# Patient Record
Sex: Female | Born: 1995 | Hispanic: Yes | Marital: Single | Smoking: Never smoker
Health system: Southern US, Community
[De-identification: ages and names within clinical notes are randomized; demographics above are authoritative.]

## PROBLEM LIST (undated history)

## (undated) ENCOUNTER — Inpatient Hospital Stay (HOSPITAL_COMMUNITY): Payer: Self-pay

## (undated) DIAGNOSIS — Z789 Other specified health status: Secondary | ICD-10-CM

## (undated) DIAGNOSIS — N83209 Unspecified ovarian cyst, unspecified side: Secondary | ICD-10-CM

## (undated) DIAGNOSIS — O24419 Gestational diabetes mellitus in pregnancy, unspecified control: Secondary | ICD-10-CM

## (undated) HISTORY — DX: Gestational diabetes mellitus in pregnancy, unspecified control: O24.419

## (undated) HISTORY — PX: NO PAST SURGERIES: SHX2092

---

## 2014-10-30 ENCOUNTER — Emergency Department (HOSPITAL_COMMUNITY)
Admission: EM | Admit: 2014-10-30 | Discharge: 2014-10-30 | Disposition: A | Discharge status: Home / Self Care | Payer: Self-pay | Attending: Emergency Medicine | Admitting: Emergency Medicine

## 2014-10-30 ENCOUNTER — Encounter (HOSPITAL_COMMUNITY): Payer: Self-pay | Admitting: Emergency Medicine

## 2014-10-30 DIAGNOSIS — Z3202 Encounter for pregnancy test, result negative: Secondary | ICD-10-CM | POA: Insufficient documentation

## 2014-10-30 DIAGNOSIS — R1032 Left lower quadrant pain: Secondary | ICD-10-CM | POA: Insufficient documentation

## 2014-10-30 DIAGNOSIS — Z8742 Personal history of other diseases of the female genital tract: Secondary | ICD-10-CM | POA: Insufficient documentation

## 2014-10-30 DIAGNOSIS — R1031 Right lower quadrant pain: Secondary | ICD-10-CM | POA: Insufficient documentation

## 2014-10-30 DIAGNOSIS — R102 Pelvic and perineal pain: Secondary | ICD-10-CM | POA: Insufficient documentation

## 2014-10-30 HISTORY — DX: Unspecified ovarian cyst, unspecified side: N83.209

## 2014-10-30 LAB — CBC WITH DIFFERENTIAL/PLATELET
Basophils Absolute: 0 10*3/uL (ref 0.0–0.1)
Basophils Relative: 0 % (ref 0–1)
Eosinophils Absolute: 0.1 10*3/uL (ref 0.0–0.7)
Eosinophils Relative: 1 % (ref 0–5)
HEMATOCRIT: 44.6 % (ref 36.0–46.0)
HEMOGLOBIN: 15.2 g/dL — AB (ref 12.0–15.0)
Lymphocytes Relative: 22 % (ref 12–46)
Lymphs Abs: 2 10*3/uL (ref 0.7–4.0)
MCH: 28.8 pg (ref 26.0–34.0)
MCHC: 34.1 g/dL (ref 30.0–36.0)
MCV: 84.5 fL (ref 78.0–100.0)
MONO ABS: 0.9 10*3/uL (ref 0.1–1.0)
MONOS PCT: 9 % (ref 3–12)
NEUTROS ABS: 6.1 10*3/uL (ref 1.7–7.7)
Neutrophils Relative %: 68 % (ref 43–77)
PLATELETS: 294 10*3/uL (ref 150–400)
RBC: 5.28 MIL/uL — ABNORMAL HIGH (ref 3.87–5.11)
RDW: 13.2 % (ref 11.5–15.5)
WBC: 9 10*3/uL (ref 4.0–10.5)

## 2014-10-30 LAB — URINALYSIS, ROUTINE W REFLEX MICROSCOPIC
BILIRUBIN URINE: NEGATIVE
Ketones, ur: NEGATIVE mg/dL
Leukocytes, UA: NEGATIVE
Nitrite: NEGATIVE
Protein, ur: 100 mg/dL — AB
SPECIFIC GRAVITY, URINE: 1.027 (ref 1.005–1.030)
UROBILINOGEN UA: 1 mg/dL (ref 0.0–1.0)
pH: 6 (ref 5.0–8.0)

## 2014-10-30 LAB — COMPREHENSIVE METABOLIC PANEL
ALT: 175 U/L — AB (ref 14–54)
ANION GAP: 10 (ref 5–15)
AST: 70 U/L — ABNORMAL HIGH (ref 15–41)
Albumin: 3.9 g/dL (ref 3.5–5.0)
Alkaline Phosphatase: 91 U/L (ref 38–126)
BUN: 10 mg/dL (ref 6–20)
CO2: 23 mmol/L (ref 22–32)
Calcium: 9.5 mg/dL (ref 8.9–10.3)
Chloride: 100 mmol/L — ABNORMAL LOW (ref 101–111)
Creatinine, Ser: 0.49 mg/dL (ref 0.44–1.00)
GFR calc Af Amer: >60 mL/min (ref >60)
Glucose, Bld: 217 mg/dL — ABNORMAL HIGH (ref 65–99)
Potassium: 3.9 mmol/L (ref 3.5–5.1)
SODIUM: 133 mmol/L — AB (ref 135–145)
Total Bilirubin: 0.5 mg/dL (ref 0.3–1.2)
Total Protein: 7 g/dL (ref 6.5–8.1)

## 2014-10-30 LAB — URINE MICROSCOPIC-ADD ON

## 2014-10-30 LAB — LIPASE, BLOOD: LIPASE: 40 U/L (ref 22–51)

## 2014-10-30 LAB — POC URINE PREG, ED: Preg Test, Ur: NEGATIVE

## 2014-10-30 MED ORDER — HYDROCODONE-ACETAMINOPHEN 5-325 MG PO TABS: 1.0000 | ORAL_TABLET | ORAL | Status: DC | PRN

## 2014-10-30 MED ORDER — IBUPROFEN 800 MG PO TABS
800.0000 mg | ORAL_TABLET | Freq: Once | ORAL | Status: AC
  Administered 2014-10-30: 800 mg via ORAL
  Filled 2014-10-30: qty 1

## 2014-10-30 MED ORDER — NAPROXEN 500 MG PO TABS: 500.0000 mg | ORAL_TABLET | Freq: Two times a day (BID) | ORAL | Status: DC

## 2014-10-30 NOTE — Discharge Instructions (Signed)
Pelvic Pain Pelvic pain is pain felt below the belly button and between your hips. It can be caused by many different things. It is important to get help right away. This is especially true for severe, sharp, or unusual pain that comes on suddenly.  HOME CARE  Only take medicine as told by your doctor.  Rest as told by your doctor.  Eat a healthy diet, such as fruits, vegetables, and lean meats.  Drink enough fluids to keep your pee (urine) clear or pale yellow, or as told.  Avoid sex (intercourse) if it causes pain.  Apply warm or cold packs to your lower belly (abdomen). Use the type of pack that helps the pain.  Avoid situations that cause you stress.  Keep a journal to track your pain. Write down:  When the pain started.  Where it is located.  If there are things that seem to be related to the pain, such as food or your period.  Follow up with your doctor as told. GET HELP RIGHT AWAY IF:   You have heavy bleeding from the vagina.  You have more pelvic pain.  You feel lightheaded or pass out (faint).  You have chills.  You have pain when you pee or have blood in your pee.  You cannot stop having watery poop (diarrhea).  You cannot stop throwing up (vomiting).  You have a fever or lasting symptoms for more than 3 days.  You have a fever and your symptoms suddenly get worse.  You are being physically or sexually abused.  Your medicine does not help your pain.  You have fluid (discharge) coming from your vagina that is not normal. MAKE SURE YOU:  Understand these instructions.  Will watch your condition.  Will get help if you are not doing well or get worse. Document Released: 10/28/2007 Document Revised: 11/10/2011 Document Reviewed: 08/31/2011 ExitCare Patient Information 2015 [employer]. This information is not intended to replace advice given to you by your health care provider. Make sure you discuss any questions you have with your health care  provider.  

## 2014-10-30 NOTE — ED Provider Notes (Signed)
CSN: [id number]     Arrival date & time 10/30/14  4098 History   First MD Initiated Contact with Patient 10/30/14 [phone]     Chief Complaint  Patient presents with  . Abdominal Pain      HPI  She presents for evaluation of lower abdominal pain. She states is been present for the last 8 months. She had a negative ultrasound 8 months ago. Was told one point that it could be a cyst. However she states this was not demonstrated on ultrasound. This bilateral lower quadrants, right greater than left. His worthless menses. She does not have any spotting. She denies discharge in between her periods or any spotting. No dysuria frequency. No pain with intercourse.  No fevers. No nausea vomiting diarrhea.  Past Medical History  Diagnosis Date  . Ovarian cyst    History reviewed. No pertinent past surgical history. History reviewed. No pertinent family history. History  Substance Use Topics  . Smoking status: Never Smoker   . Smokeless tobacco: Not on file  . Alcohol Use: No   OB History    No data available     Review of Systems  Constitutional: Negative for fever, chills, diaphoresis, appetite change and fatigue.  HENT: Negative for mouth sores, sore throat and trouble swallowing.   Eyes: Negative for visual disturbance.  Respiratory: Negative for cough, chest tightness, shortness of breath and wheezing.   Cardiovascular: Negative for chest pain.  Gastrointestinal: Negative for nausea, vomiting, abdominal pain, diarrhea and abdominal distention.  Endocrine: Negative for polydipsia, polyphagia and polyuria.  Genitourinary: Positive for pelvic pain. Negative for dysuria, frequency and hematuria.  Musculoskeletal: Negative for gait problem.  Skin: Negative for color change, pallor and rash.  Neurological: Negative for dizziness, syncope, light-headedness and headaches.  Hematological: Does not bruise/bleed easily.  Psychiatric/Behavioral: Negative for behavioral problems and confusion.       Allergies  Review of patient's allergies indicates no known allergies.  Home Medications   Prior to Admission medications   Medication Sig Start Date End Date Taking? Authorizing Provider  HYDROcodone-acetaminophen (NORCO/VICODIN) 5-325 MG per tablet Take 1 tablet by mouth every 4 (four) hours as needed. 10/30/14   Rolland Porter, MD  naproxen (NAPROSYN) 500 MG tablet Take 1 tablet (500 mg total) by mouth 2 (two) times daily. 10/30/14   Rolland Porter, MD   BP 115/78 mmHg  Pulse 93  Temp(Src) 98.1 F (36.7 C) (Oral)  Resp 13  SpO2 100%  LMP 10/21/2014 (Exact Date) Physical Exam  Constitutional: She is oriented to person, place, and time. She appears well-developed and well-nourished. No distress.  HENT:  Head: Normocephalic.  Eyes: Conjunctivae are normal. Pupils are equal, round, and reactive to light. No scleral icterus.  Neck: Normal range of motion. Neck supple. No thyromegaly present.  Cardiovascular: Normal rate and regular rhythm.  Exam reveals no gallop and no friction rub.   No murmur heard. Pulmonary/Chest: Effort normal and breath sounds normal. No respiratory distress. She has no wheezes. She has no rales.  Abdominal: Soft. Bowel sounds are normal. She exhibits no distension. There is no tenderness. There is no rebound.    Musculoskeletal: Normal range of motion.  Neurological: She is alert and oriented to person, place, and time.  Skin: Skin is warm and dry. No rash noted.  Psychiatric: She has a normal mood and affect. Her behavior is normal.    ED Course  Procedures (including critical care time) Labs Review Labs Reviewed  CBC WITH DIFFERENTIAL/PLATELET - Abnormal;  Notable for the following:    RBC 5.28 (*)    Hemoglobin 15.2 (*)    All other components within normal limits  COMPREHENSIVE METABOLIC PANEL - Abnormal; Notable for the following:    Sodium 133 (*)    Chloride 100 (*)    Glucose, Bld 217 (*)    AST 70 (*)    ALT 175 (*)    All other components  within normal limits  URINALYSIS, ROUTINE W REFLEX MICROSCOPIC (NOT AT [facility]) - Abnormal; Notable for the following:    APPearance CLOUDY (*)    Glucose, UA >1000 (*)    Hgb urine dipstick MODERATE (*)    Protein, ur 100 (*)    All other components within normal limits  URINE MICROSCOPIC-ADD ON - Abnormal; Notable for the following:    Bacteria, UA FEW (*)    All other components within normal limits  LIPASE, BLOOD  POC URINE PREG, ED    Imaging Review No results found.   EKG Interpretation None      MDM   Final diagnoses:  Pelvic pain in female    Patient describes episodes over the last 8 months and almost daily discomfort. Worse during menstrual cycle. Declines pelvic exam here. She is adamant that she does not have discharge or pain with intercourse. Discussed the possibility of occult pelvic infection. She still declines. Her symptoms are consistent with chronic pelvic pain and perhaps endometriosis. [facility]. Of offer her anti-inflammatory isn't simple short-term pain control for her symptoms today. She has a reassuring evaluation no fever, leukocytosis, or abnormal urine. She is not pregnant. She is appropriate for outpatient treatment for this pain of 8 months duration.    Rolland PorterMark Harleyquinn Gasser, MD 10/30/14 [phone]

## 2014-10-30 NOTE — ED Notes (Signed)
Bed: WA21 Expected date:  Expected time:  Means of arrival:  Comments: 

## 2014-10-30 NOTE — ED Notes (Signed)
Pt c/o rt low abd pain since Sunday.  States she was dx w/ cyst on her ovary last year.  Denies vomiting but she states she has been having intermittent diarrhea.

## 2014-10-30 NOTE — ED Notes (Signed)
Bed: WA19 Expected date:  Expected time:  Means of arrival:  Comments: 

## 2015-05-09 ENCOUNTER — Ambulatory Visit: Payer: Self-pay | Admitting: Allergy and Immunology

## 2015-06-26 ENCOUNTER — Emergency Department (HOSPITAL_COMMUNITY): Payer: Self-pay

## 2015-06-26 ENCOUNTER — Emergency Department (HOSPITAL_COMMUNITY)
Admission: EM | Admit: 2015-06-26 | Discharge: 2015-06-26 | Disposition: A | Discharge status: Home / Self Care | Payer: Self-pay | Attending: Emergency Medicine | Admitting: Emergency Medicine

## 2015-06-26 ENCOUNTER — Encounter (HOSPITAL_COMMUNITY): Payer: Self-pay | Admitting: Emergency Medicine

## 2015-06-26 DIAGNOSIS — R1031 Right lower quadrant pain: Secondary | ICD-10-CM | POA: Insufficient documentation

## 2015-06-26 DIAGNOSIS — R1030 Lower abdominal pain, unspecified: Secondary | ICD-10-CM | POA: Insufficient documentation

## 2015-06-26 DIAGNOSIS — O9989 Other specified diseases and conditions complicating pregnancy, childbirth and the puerperium: Secondary | ICD-10-CM | POA: Insufficient documentation

## 2015-06-26 DIAGNOSIS — R102 Pelvic and perineal pain unspecified side: Secondary | ICD-10-CM

## 2015-06-26 DIAGNOSIS — Z8742 Personal history of other diseases of the female genital tract: Secondary | ICD-10-CM | POA: Insufficient documentation

## 2015-06-26 DIAGNOSIS — Z3A00 Weeks of gestation of pregnancy not specified: Secondary | ICD-10-CM | POA: Insufficient documentation

## 2015-06-26 DIAGNOSIS — Z3490 Encounter for supervision of normal pregnancy, unspecified, unspecified trimester: Secondary | ICD-10-CM

## 2015-06-26 LAB — URINALYSIS, ROUTINE W REFLEX MICROSCOPIC
Bilirubin Urine: NEGATIVE
Glucose, UA: 250 mg/dL — AB
Ketones, ur: NEGATIVE mg/dL
LEUKOCYTES UA: NEGATIVE
NITRITE: NEGATIVE
PROTEIN: 100 mg/dL — AB
SPECIFIC GRAVITY, URINE: 1.009 (ref 1.005–1.030)
pH: 6 (ref 5.0–8.0)

## 2015-06-26 LAB — URINE MICROSCOPIC-ADD ON

## 2015-06-26 LAB — I-STAT CHEM 8, ED
BUN: 5 mg/dL — ABNORMAL LOW (ref 6–20)
CHLORIDE: 106 mmol/L (ref 101–111)
Calcium, Ion: 1.18 mmol/L (ref 1.12–1.23)
Creatinine, Ser: 0.4 mg/dL — ABNORMAL LOW (ref 0.44–1.00)
Glucose, Bld: 179 mg/dL — ABNORMAL HIGH (ref 65–99)
HEMATOCRIT: 41 % (ref 36.0–46.0)
HEMOGLOBIN: 13.9 g/dL (ref 12.0–15.0)
POTASSIUM: 4.1 mmol/L (ref 3.5–5.1)
SODIUM: 139 mmol/L (ref 135–145)
TCO2: 20 mmol/L (ref 0–100)

## 2015-06-26 LAB — PREGNANCY, URINE: PREG TEST UR: POSITIVE — AB

## 2015-06-26 LAB — CBG MONITORING, ED: GLUCOSE-CAPILLARY: 176 mg/dL — AB (ref 65–99)

## 2015-06-26 LAB — HCG, QUANTITATIVE, PREGNANCY: HCG, BETA CHAIN, QUANT, S: 732 m[IU]/mL — AB (ref <5)

## 2015-06-26 NOTE — ED Notes (Signed)
MD at bedside. 

## 2015-06-26 NOTE — ED Notes (Signed)
Patient transported to Ultrasound 

## 2015-06-26 NOTE — ED Provider Notes (Signed)
CSN: [id number]     Arrival date & time 06/26/15  1191 History   First MD Initiated Contact with Patient 06/26/15 [phone]     Chief Complaint  Patient presents with  . Abdominal Pain     Patient is a 20 y.o. female presenting with abdominal pain. The history is provided by the patient.  Abdominal Pain Pain location:  RLQ Pain quality: dull   Associated symptoms: no chills, no fever, no nausea, no shortness of breath, no vaginal bleeding, no vaginal discharge and no vomiting    patient is complaining of right-sided pelvic pain. Translator bones used to assist with the history. She's had a pain for over 6 months. She seen her clinic and diagnosed with ovarian cysts. States that she did not have an ultrasound or pelvic exam however. The patient has had pain in the whole time but has gotten better and worse at times. No fevers or chills. She states she will sometimes have pain in her ovary when she urinates. No vaginal bleeding or discharge. Last menses was just over a month ago. The pain does seem to get worse around her menses time.  History reviewed. No pertinent past medical history. History reviewed. No pertinent past surgical history. No family history on file. Social History  Substance Use Topics  . Smoking status: Never Smoker   . Smokeless tobacco: None  . Alcohol Use: No   OB History    No data available     Review of Systems  Constitutional: Negative for fever and chills.  Respiratory: Negative for shortness of breath.   Gastrointestinal: Positive for abdominal pain. Negative for nausea and vomiting.  Genitourinary: Positive for pelvic pain. Negative for flank pain, vaginal bleeding, vaginal discharge, genital sores and vaginal pain.  Skin: Negative for color change and rash.      Allergies  Review of patient's allergies indicates no known allergies.  Home Medications   Prior to Admission medications   Not on File   BP 111/80 mmHg  Pulse 88  Temp(Src) 99.2 F (37.3  C) (Oral)  Resp 16  SpO2 100%  LMP 05/25/2015 (Exact Date) Physical Exam  Constitutional: She appears well-developed.  HENT:  Head: Atraumatic.  Neck: Neck supple.  Cardiovascular: Normal rate.   Pulmonary/Chest: Effort normal.  Abdominal: There is tenderness.  Suprapubic and right lower quadrant tenderness without rebound or guarding.  Genitourinary: Vagina normal.  Musculoskeletal: Normal range of motion.  Neurological: She is alert.  Skin: Skin is warm.    ED Course  Procedures (including critical care time) Labs Review Labs Reviewed  URINALYSIS, ROUTINE W REFLEX MICROSCOPIC (NOT AT [facility]) - Abnormal; Notable for the following:    Glucose, UA 250 (*)    Hgb urine dipstick MODERATE (*)    Protein, ur 100 (*)    All other components within normal limits  PREGNANCY, URINE - Abnormal; Notable for the following:    Preg Test, Ur POSITIVE (*)    All other components within normal limits  HCG, QUANTITATIVE, PREGNANCY - Abnormal; Notable for the following:    hCG, Beta Chain, Quant, S 732 (*)    All other components within normal limits  URINE MICROSCOPIC-ADD ON - Abnormal; Notable for the following:    Squamous Epithelial / LPF 6-30 (*)    Bacteria, UA FEW (*)    Casts GRANULAR CAST (*)    All other components within normal limits  CBG MONITORING, ED - Abnormal; Notable for the following:    Glucose-Capillary 176 (*)  All other components within normal limits  I-STAT CHEM 8, ED - Abnormal; Notable for the following:    BUN 5 (*)    Creatinine, Ser 0.40 (*)    Glucose, Bld 179 (*)    All other components within normal limits  RPR  HIV ANTIBODY (ROUTINE TESTING)    Imaging Review US Ob Comp Less 14 Wks  06/26/2015  CLINICAL DATA:  Pelvic pain for 6 months. EXAM: OBSTETRIC <14 WK [place] AND TRANSVAGINAL OB US TECHNIQUE: Both transabdominal and transvaginal ultrasound examinations were performed for complete evaluation of the gestation as well as the maternal uterus, adnexal  regions, and pelvic cul-de-sac. Transvaginal technique was performed to assess early pregnancy. COMPARISON:  None. FINDINGS: Intrauterine gestational sac: Not visualized. Yolk sac:  Not visualized. Embryo:  Not visualized. Cardiac Activity: Not visualized. Maternal uterus/adnexae: The uterus measures 7.7 x 4.4 by 4.0 cm. The endometrium is heterogeneously thickened to 2.1 cm. Right ovary has normal appearance, measuring 2.8 x 1.4 by 1.9 cm. The left ovary measures 1.9 x 3.6 x 2.2 cm. There is a solid-appearing mass on the left ovary which measures 2.0 x 1.9 by 1.4 cm and demonstrates peripherally increased blood flow. There is a small amount of free pelvic fluid most notable within the left adnexa. IMPRESSION: Normal size of the uterus with heterogeneously thickened endometrium to 2.1 cm. No intrauterine pregnancy seen. Normal appearance of the right ovary. Enlarged (in comparison to the right) left ovary which contains a solid appearing 2 cm mass with peripherally increased blood flow. This may represent a physiologic corpus luteum, endometrioma, or potentially beta HCG producing ovarian malignancy such as a germ-cell tumor. In the settings of confirmed positive serum pregnancy test, an ectopic pregnancy may also be considered, however the sonographic appearance is not typical. Small amount of free pelvic fluid, most prominent about the left adnexa. OB/ GY consult is recommended. Electronically Signed   By: Ted Mcalpine M.D.   On: 06/26/2015 11:05   US Ob Transvaginal  06/26/2015  CLINICAL DATA:  Pelvic pain for 6 months. EXAM: OBSTETRIC <14 WK [place] AND TRANSVAGINAL OB US TECHNIQUE: Both transabdominal and transvaginal ultrasound examinations were performed for complete evaluation of the gestation as well as the maternal uterus, adnexal regions, and pelvic cul-de-sac. Transvaginal technique was performed to assess early pregnancy. COMPARISON:  None. FINDINGS: Intrauterine gestational sac: Not visualized.  Yolk sac:  Not visualized. Embryo:  Not visualized. Cardiac Activity: Not visualized. Maternal uterus/adnexae: The uterus measures 7.7 x 4.4 by 4.0 cm. The endometrium is heterogeneously thickened to 2.1 cm. Right ovary has normal appearance, measuring 2.8 x 1.4 by 1.9 cm. The left ovary measures 1.9 x 3.6 x 2.2 cm. There is a solid-appearing mass on the left ovary which measures 2.0 x 1.9 by 1.4 cm and demonstrates peripherally increased blood flow. There is a small amount of free pelvic fluid most notable within the left adnexa. IMPRESSION: Normal size of the uterus with heterogeneously thickened endometrium to 2.1 cm. No intrauterine pregnancy seen. Normal appearance of the right ovary. Enlarged (in comparison to the right) left ovary which contains a solid appearing 2 cm mass with peripherally increased blood flow. This may represent a physiologic corpus luteum, endometrioma, or potentially beta HCG producing ovarian malignancy such as a germ-cell tumor. In the settings of confirmed positive serum pregnancy test, an ectopic pregnancy may also be considered, however the sonographic appearance is not typical. Small amount of free pelvic fluid, most prominent about the left adnexa. OB/ GY  consult is recommended. Electronically Signed   By: Ted Mcalpine M.D.   On: 06/26/2015 11:05   I have personally reviewed and evaluated these images and lab results as part of my medical decision-making.   EKG Interpretation None      MDM   Final diagnoses:  Pregnant    Patient pregnant with lower abdominal pain. Pain is been there for 6 months without an acute ectopic pregnancy, however she does have positive pregnancy test and a potential left adnexal finding. Discussed with Dr. Shawnie Pons who will see the patient follow-up in 2 days in the [facility] clinic. Patient was given the instructions on what to watch for for ectopic pregnancy since it is not been ruled out. Discharge home. Pelvic exam deferred at  this time.    Benjiman Core, MD 06/26/15 [phone]

## 2015-06-26 NOTE — Discharge Instructions (Signed)
You pregnancy has not been verified to be in the uterus. Go to [facility] for lightheadedness dizziness or vaginal bleeding. Also return for increased pain. Follow-up [facility] on Friday at 8 AM.  Vanetta Mulders ectpico (Ectopic Pregnancy) Un embarazo ectpico ocurre cuando el vulo fertilizado se fija (implanta) fuera del tero. La mayora de los embarazos ectpicos se producen en la trompa de [place]. Es raro que ocurran en el ovario, el intestino, la pelvis o el cuello uterino. En un embarazo ectpico, el vulo fertilizado no tiene la capacidad de llegar a ser un beb normal y sano.  Neomia Dear ruptura en el embarazo ectpico se produce cuando la trompa de [place]io se desgarra o se rompe y Futures trader como resultado una hemorragia interna. A menudo hay un intenso dolor abdominal y en algunos casos sangrado vaginal. Tener un embarazo ectpico puede ser Neomia Dear experiencia que pone en peligro la vida. Si no se trata, esta grave afeccin puede requerir una transfusin de [place], ciruga abdominal, o incluso causar la muerte. CAUSAS  En la [employer] de los casos se sospecha un dao en las trompas de Falopio como causa del Multimedia programmer.  FACTORES DE RIESGO Dependiendo de sus circunstancias, el nivel de riesgo de tener un embarazo ectpico variar. El [place] de riesgo puede dividirse en tres categoras. Alto Riesgo  Usted ha realizado tratamientos para la infertilidad.  Ha tenido un embarazo ectpico previo.  Fue sometida a [place] de trompas.  Tuvo una ciruga previa para ligar las trompas de [place] (ligadura de trompas).  Tiene problemas o enfermedades en las trompas.  Ha estado expuesta al DES. El DES es un medicamento que se [place] [employer] 1973 y [employer] en los bebs cuyas madres lo tomaron.  Queda embarazada mientras [place] un dispositivo intrauterino (DIU) como mtodo anticonceptivo. Riesgo moderado  Tiene antecedentes de infertilidad.  Ha sufrido alguna enfermedad de transmisin  sexual (ETS).  Tiene antecedentes de enfermedad plvica inflamatoria (EPI).  Tiene cicatrices por endometriosis.  Tiene mltiples parejas sexuales.  Fuma. Bajo riesgo  Fue sometida a una ciruga plvica.  [place] duchas vaginales.  Comenz a ser [employer] de los 18 aos de [place]. SIGNOS Y SNTOMAS  El embarazo ectpico se debe sospechar en cualquier mujer a la que le ha faltado un perodo y tiene dolor abdominal o sangrado.  Puede experimentar sntomas normales de Psychiatrist, tales como:  Nuseas.  Cansancio.  Inflamacin mamaria.  Otros sntomas son:  Futures trader.  Hemorragia vaginal o manchado irregular.  Clicos o dolor en uno de los lados o en la zona inferior del abdomen.  Latidos cardacos acelerados.  Desmayarse al defecar.  Los sntomas de un embarazo ectpico con ruptura y hemorragias internas son:  Dolor intenso y sbito en el abdomen y la pelvis.  Mareos o [employer].  Dolor en la zona del hombro. DIAGNSTICO  Los exmenes que se indicarn son:  Test de [place].  Una ecografa.  Prueba de nivel especfico de hormona del embarazo en el torrente sanguneo.  Extraccin de [place] de tejido del tero (dilatacin y curetaje, D y C).  Ciruga para Charity fundraiser visual del interior del abdomen usando un tubo delgado, que emite luz con una pequea cmara en el extremo (laparoscopio). TRATAMIENTO  Se podr aplicar una inyeccin de un medicamento denominado metotrexato. Este medicamento hace que se absorba el tejido del [place]. Se administra en los siguientes casos:  Hay un diagnstico temprano.  La trompa de Falopio no se ha  roto.  Se la considera una buena candidata para recibir Research scientist (medical). Por lo general, despus del tratamiento con metotrexato se comprueban los niveles de hormonas del [place]. Esto se hace para asegurarse de que el medicamento es [place]. Puede llevar entre 4 y 6 semanas para que  el Psychiatrist sea absorbido (aunque la mayora de los embarazos se Environmental health practitioner a Agricultural engineer). Puede ser necesario realizar un tratamiento quirrgico. Se puede utilizar un laparoscopio para retirar el tejido del Psychiatrist. Si hay una hemorragia interna grave, se hace un corte (incisin) en la zona inferior del abdomen (laparotoma) y se extirpa el embarazo ectpico. De este modo de detendr el sangrado. Es posible que tambin se extirpe parte de la trompa de Falopio o la trompa entera (salpingectoma). Luego de la [place], le harn un anlisis de hormona del embarazo para asegurarse de que no quedan tejidos. Quizs reciba la vacuna de inmunoglobulina Rho(D) si usted es Rh negativa y el padre es Rh positivo, o si no conoce el tipo de Rh del padre. Esto evita problemas en prximos embarazos. SOLICITE ATENCIN MDICA DE INMEDIATO SI:  Tiene sntomas de embarazo ectpico. Esto es una emergencia mdica. ASEGRESE DE QUE:  Comprende estas instrucciones.  Controlar su afeccin.  Recibir ayuda de inmediato si no mejora o si empeora.   Esta informacin no tiene Theme park manager el consejo del mdico. Asegrese de hacerle al mdico cualquier pregunta que tenga.   Document Released: 05/11/2005 Document Revised: 06/01/2014 Elsevier Interactive Patient Education [employer]

## 2015-06-26 NOTE — ED Notes (Signed)
Spanish interpretor line used to communicate with patient.

## 2015-06-26 NOTE — ED Notes (Addendum)
Pt reports abdominal pain for 6 months, denies n/v/d or urinary symptoms. Last menstrual period was December. NAD at this time.

## 2015-06-27 LAB — HIV ANTIBODY (ROUTINE TESTING W REFLEX): HIV SCREEN 4TH GENERATION: NONREACTIVE

## 2015-06-27 LAB — RPR: RPR: NONREACTIVE

## 2015-06-28 ENCOUNTER — Ambulatory Visit (INDEPENDENT_AMBULATORY_CARE_PROVIDER_SITE_OTHER): Payer: Self-pay | Admitting: Family Medicine

## 2015-06-28 DIAGNOSIS — R109 Unspecified abdominal pain: Secondary | ICD-10-CM

## 2015-06-28 DIAGNOSIS — O3680X0 Pregnancy with inconclusive fetal viability, not applicable or unspecified: Secondary | ICD-10-CM

## 2015-06-28 DIAGNOSIS — O9989 Other specified diseases and conditions complicating pregnancy, childbirth and the puerperium: Secondary | ICD-10-CM

## 2015-06-28 LAB — HCG, QUANTITATIVE, PREGNANCY: hCG, Beta Chain, Quant, S: 2071 m[IU]/mL — ABNORMAL HIGH (ref <5)

## 2015-06-28 NOTE — Patient Instructions (Signed)
Embarazo ectpico (Ectopic Pregnancy) Un embarazo ectpico ocurre cuando un vulo fecundado se desarrolla fuera del tero. Un embarazo no puede subsistir fuera del tero. Este problema generalmente ocurre en las trompas de [place]. La causa es, con frecuencia, un dao en la trompa de [place]. Si el problema se detecta a tiempo, puede tratarse con medicamentos. Si el conducto se fisura o estalla (hay ruptura), tendr Neomia Dear hemorragia interna. Esto es Radio broadcast assistant. Deber someterse a [place]. Solicite ayuda de inmediato.  SNTOMAS Al principio puede tener sntomas de un embarazo normal. Estos pueden ser:  Falta del perodo menstrual.  Ganas de vomitar (nuseas).  Sensacin de cansancio.  Hinchazn de las mamas. Luego podr a comenzar a tener sntomas que no son normales. Estos pueden ser:  Dolor durante el coito (relacin sexual).  Hemorragia por la vagina. Esto incluye el sangrado leve (prdidas).  [place] o dolor en el vientre (abdomen) o en la zona inferior del vientre. Estos pueden sentirse en uno de los lados.  Latidos cardacos rpidos (pulso).  Perder la conciencia (desmayarse) despus de ir de cuerpo (defecar). Si el conducto se rompe, puede tener sntomas como:  Dolor muy intenso en el vientre o parte baja del vientre. Esto se produce repentinamente.  Mareos.  Desmayo.  Dolor en el hombro. SOLICITE AYUDA DE INMEDIATO SI:  Tiene alguno de estos sntomas. Esto es Radio broadcast assistant. ASEGRESE DE QUE:  Comprende estas instrucciones.  Controlar su afeccin.  Recibir ayuda de inmediato si no mejora o si empeora.   Esta informacin no tiene Theme park manager el consejo del mdico. Asegrese de hacerle al mdico cualquier pregunta que tenga.   Document Released: 04/30/2011 Document Revised: 05/16/2013 Elsevier Interactive Patient Education [employer]

## 2015-06-28 NOTE — Progress Notes (Signed)
  Ms. Alice Velez  is a 20 y.o. No obstetric history on file. at Unknown who presents to the [facility] today for follow-up quant hCG after 48 hours. The patient was seen in ED on 2/1 and had quant hCG of 732 and US showed no gestational sac. May have a corpus luteum cyst. She denies/endorses right upper quadrant pain, vaginal bleeding or fever today.   OB History  No data available    No past medical history on file.   LMP 05/25/2015 (Exact Date)  CONSTITUTIONAL: Well-developed, well-nourished female in no acute distress.  ENT: External right and left ear normal.  EYES: EOM intact, conjunctivae normal.  MUSCULOSKELETAL: Normal range of motion.  CARDIOVASCULAR: Regular heart rate RESPIRATORY: Normal effort NEUROLOGICAL: Alert and oriented to person, place, and time.  SKIN: Skin is warm and dry. No rash noted. Not diaphoretic. No erythema. No pallor. PSYCH: Normal mood and affect. Normal behavior. Normal judgment and thought content.  Results for orders placed or performed in visit on 06/28/15 (from the past 24 hour(s))  hCG, quantitative, pregnancy     Status: Abnormal   Collection Time: 06/28/15  8:35 AM  Result Value Ref Range   hCG, Beta Chain, Quant, S 2071 (H) <5 mIU/mL    A: Appropriate rise in quant hCG after 48 hours  P: Discharge home First trimester/ectopic precautions discussed Patient will return for follow-up US in 10 days. Order placed and RN to schedule. Patient will return to [facility] for results following [place].  Patient may return to [facility] as needed or if her condition were to change or worsen   Levie Heritage, DO 06/28/2015 11:26 AM

## 2015-07-09 ENCOUNTER — Ambulatory Visit (HOSPITAL_COMMUNITY)
Admission: RE | Admit: 2015-07-09 | Discharge: 2015-07-09 | Disposition: A | Discharge status: Home / Self Care | Payer: Self-pay | Source: Ambulatory Visit | Attending: Family Medicine | Admitting: Family Medicine

## 2015-07-09 DIAGNOSIS — Z36 Encounter for antenatal screening of mother: Secondary | ICD-10-CM | POA: Insufficient documentation

## 2015-07-09 DIAGNOSIS — O209 Hemorrhage in early pregnancy, unspecified: Secondary | ICD-10-CM | POA: Insufficient documentation

## 2015-07-09 DIAGNOSIS — Z3A01 Less than 8 weeks gestation of pregnancy: Secondary | ICD-10-CM | POA: Insufficient documentation

## 2015-07-09 DIAGNOSIS — O3680X0 Pregnancy with inconclusive fetal viability, not applicable or unspecified: Secondary | ICD-10-CM

## 2015-08-07 ENCOUNTER — Encounter: Payer: Self-pay | Admitting: Advanced Practice Midwife

## 2015-08-07 ENCOUNTER — Telehealth: Payer: Self-pay | Admitting: Family Medicine

## 2015-08-07 NOTE — Telephone Encounter (Signed)
Called patient due to no show for new ob appointment. Patient informed me that she is schedule to begin care at [facility].

## 2015-08-29 LAB — OB RESULTS CONSOLE ANTIBODY SCREEN: ANTIBODY SCREEN: NEGATIVE

## 2015-08-29 LAB — OB RESULTS CONSOLE GC/CHLAMYDIA
Chlamydia: NEGATIVE
Gonorrhea: NEGATIVE

## 2015-08-29 LAB — OB RESULTS CONSOLE RPR: RPR: NONREACTIVE

## 2015-08-29 LAB — OB RESULTS CONSOLE ABO/RH: RH TYPE: POSITIVE

## 2015-08-29 LAB — OB RESULTS CONSOLE VARICELLA ZOSTER ANTIBODY, IGG: VARICELLA IGG: IMMUNE

## 2015-08-29 LAB — OB RESULTS CONSOLE HEPATITIS B SURFACE ANTIGEN: HEP B S AG: NEGATIVE

## 2015-08-29 LAB — OB RESULTS CONSOLE RUBELLA ANTIBODY, IGM: Rubella: IMMUNE

## 2015-08-29 LAB — OB RESULTS CONSOLE HGB/HCT, BLOOD
HCT: 38 %
HEMOGLOBIN: 12.8 g/dL

## 2015-08-29 LAB — OB RESULTS CONSOLE HIV ANTIBODY (ROUTINE TESTING): HIV: NONREACTIVE

## 2015-08-29 LAB — OB RESULTS CONSOLE PLATELET COUNT: Platelets: 374 10*3/uL

## 2015-10-14 ENCOUNTER — Encounter: Payer: Self-pay | Admitting: Obstetrics and Gynecology

## 2015-10-14 ENCOUNTER — Encounter: Payer: Medicaid Other | Attending: Obstetrics and Gynecology | Admitting: Dietician

## 2015-10-14 ENCOUNTER — Encounter: Payer: Self-pay | Admitting: *Deleted

## 2015-10-14 ENCOUNTER — Ambulatory Visit (INDEPENDENT_AMBULATORY_CARE_PROVIDER_SITE_OTHER): Payer: Self-pay | Admitting: Obstetrics and Gynecology

## 2015-10-14 VITALS — BP 104/64 | HR 77 | Ht 59.0 in | Wt 132.5 lb

## 2015-10-14 DIAGNOSIS — O0990 Supervision of high risk pregnancy, unspecified, unspecified trimester: Secondary | ICD-10-CM

## 2015-10-14 DIAGNOSIS — O24319 Unspecified pre-existing diabetes mellitus in pregnancy, unspecified trimester: Secondary | ICD-10-CM

## 2015-10-14 DIAGNOSIS — Z029 Encounter for administrative examinations, unspecified: Secondary | ICD-10-CM | POA: Insufficient documentation

## 2015-10-14 DIAGNOSIS — O24419 Gestational diabetes mellitus in pregnancy, unspecified control: Secondary | ICD-10-CM

## 2015-10-14 DIAGNOSIS — O24414 Gestational diabetes mellitus in pregnancy, insulin controlled: Secondary | ICD-10-CM | POA: Insufficient documentation

## 2015-10-14 LAB — POCT URINALYSIS DIP (DEVICE)
Bilirubin Urine: NEGATIVE
Glucose, UA: NEGATIVE mg/dL
Ketones, ur: NEGATIVE mg/dL
Leukocytes, UA: NEGATIVE
NITRITE: NEGATIVE
PH: 7 (ref 5.0–8.0)
PROTEIN: NEGATIVE mg/dL
SPECIFIC GRAVITY, URINE: 1.015 (ref 1.005–1.030)
UROBILINOGEN UA: 0.2 mg/dL (ref 0.0–1.0)

## 2015-10-14 LAB — TSH: TSH: 1.04 mIU/L

## 2015-10-14 LAB — COMPREHENSIVE METABOLIC PANEL
ALBUMIN: 3.8 g/dL (ref 3.6–5.1)
ALT: 25 U/L (ref 6–29)
AST: 21 U/L (ref 10–30)
Alkaline Phosphatase: 56 U/L (ref 33–115)
BUN: 7 mg/dL (ref 7–25)
CALCIUM: 9.6 mg/dL (ref 8.6–10.2)
CO2: 26 mmol/L (ref 20–31)
Chloride: 101 mmol/L (ref 98–110)
Creat: 0.48 mg/dL — ABNORMAL LOW (ref 0.50–1.10)
Glucose, Bld: 125 mg/dL — ABNORMAL HIGH (ref 65–99)
Potassium: 4.6 mmol/L (ref 3.5–5.3)
Sodium: 135 mmol/L (ref 135–146)
Total Bilirubin: 0.3 mg/dL (ref 0.2–1.2)
Total Protein: 6.8 g/dL (ref 6.1–8.1)

## 2015-10-14 MED ORDER — ASPIRIN EC 81 MG PO TBEC: 81.0000 mg | DELAYED_RELEASE_TABLET | Freq: Every day | ORAL | Status: DC

## 2015-10-14 NOTE — Progress Notes (Signed)
10/14/15 Diabetes Educator: Assisted by the in clinic spanish interpreter and the telephone Spanish interpreter. 1 st baby. Denies a family hx of type 2 DM.  [facility] 20 weeks [facility]   Completed review of GDM and the recommended self-management interventions for controlling blood glucose. Advised to walk in the cooler part of the day for 30 minutes daily. Review of the restricted CHO diet and need to use the food label for cHO monitoring and the Diabetes Exchange when the label is not available. Advised to use the 2% milk or the skim milk.   Review blood glucose monitoring procedure.  Provided a True Track mete Lot # [id number] with a box of 50 strips and 100 lancets.   Return demonstration revealed a blood glucose at 12:15 pm of 160 mg/dl.  Has been sipping on sweetened coconut milk water during the entire morning.  Provided cup of H2O and advised to no longer use the sweetened beverages or sweetened foods. Provided Spanish handout Nutition, Diabetes and Pregnancy.  Will follow in clinic.  Maggie Shauntel Prest, RN, RD

## 2015-10-14 NOTE — Progress Notes (Signed)
Subjective:  Alice BroachRosa Romero Velez is a 20 y.o. G1P0000 at 8077w4d being seen today for initail prenatal care.  She is currently monitored for the following issues for this high-risk pregnancy and has Supervision of high risk pregnancy, antepartum and Gestational diabetes mellitus (GDM), antepartum on her problem list.  Patient reports no complaints.   . Vag. Bleeding: None.  Movement: Absent. Denies leaking of fluid.   Referred for gdm. 1-hour 258.  The following portions of the patient's history were reviewed and updated as appropriate: allergies, current medications, past family history, past medical history, past social history, past surgical history and problem list. Problem list updated.  Objective:   Filed Vitals:   10/14/15 0907 10/14/15 0918  BP:  104/64  Pulse:  77  Height: 4\' 11"  (1.499 m)   Weight:  132 lb 8 oz (60.102 kg)    Fetal Status:     Movement: Absent     General:  Alert, oriented and cooperative. Patient is in no acute distress.  Skin: Skin is warm and dry. No rash noted.   Cardiovascular: Normal heart rate noted  Respiratory: Normal respiratory effort, no problems with respiration noted  Abdomen: Soft, gravid, appropriate for gestational age. Pain/Pressure: Present     Pelvic: Vag. Bleeding: None     Cervical exam deferred        Extremities: Normal range of motion.  Edema: None  Mental Status: Normal mood and affect. Normal behavior. Normal judgment and thought content.   Urinalysis: Urine Protein: Negative Urine Glucose: Negative  Assessment and Plan:  Pregnancy: G1P0000 at 8577w4d   2. Gestational diabetes mellitus (GDM), antepartum, gestational diabetes method of control unspecified - labs - start asa - fetal echo ordered - dm educator, f/u one week - nutrition  Preterm labor symptoms and general obstetric precautions including but not limited to vaginal bleeding, contractions, leaking of fluid and fetal movement were reviewed in detail with the  patient. Please refer to After Visit Summary for other counseling recommendations.   Kathrynn RunningNoah Bedford Jeyla Bulger, MD

## 2015-10-14 NOTE — Progress Notes (Signed)
Urine: small amt hgb Plans to both breastfeed and give formula Breastfeeding tip reviewed Spanish interpreter Deatra InaArturo [phone]

## 2015-10-14 NOTE — Progress Notes (Signed)
Fetal echo scheduled 06/19 @ 1pm

## 2015-10-15 LAB — HEMOGLOBIN A1C
Hgb A1c MFr Bld: 6.4 % — ABNORMAL HIGH (ref <5.7)
Mean Plasma Glucose: 137 mg/dL

## 2015-10-15 LAB — PROTEIN / CREATININE RATIO, URINE
Creatinine, Urine: 69 mg/dL (ref 20–320)
Protein Creatinine Ratio: 203 mg/g creat — ABNORMAL HIGH (ref 21–161)
Total Protein, Urine: 14 mg/dL (ref 5–24)

## 2015-10-16 ENCOUNTER — Other Ambulatory Visit: Payer: Self-pay

## 2015-10-17 LAB — PROTEIN, URINE, 24 HOUR
Protein, 24H Urine: 300 mg/24 h — ABNORMAL HIGH (ref <150)
Protein, Urine: 10 mg/dL (ref 5–24)

## 2015-10-17 LAB — CREATININE CLEARANCE, URINE, 24 HOUR
CREAT CLEAR: 169 mL/min — AB (ref 75–115)
CREATININE, URINE: 39 mg/dL (ref 20–320)
CREATININE: 0.48 mg/dL — AB (ref 0.50–1.10)
Creatinine, 24H Ur: 1.17 g/(24.h) (ref 0.63–2.50)

## 2015-10-28 ENCOUNTER — Encounter: Payer: Self-pay | Admitting: Obstetrics and Gynecology

## 2015-10-28 ENCOUNTER — Ambulatory Visit (INDEPENDENT_AMBULATORY_CARE_PROVIDER_SITE_OTHER): Payer: Self-pay | Admitting: Obstetrics and Gynecology

## 2015-10-28 ENCOUNTER — Encounter (HOSPITAL_COMMUNITY): Payer: Self-pay

## 2015-10-28 VITALS — BP 106/66 | HR 79 | Wt 134.6 lb

## 2015-10-28 DIAGNOSIS — E119 Type 2 diabetes mellitus without complications: Secondary | ICD-10-CM

## 2015-10-28 DIAGNOSIS — O0990 Supervision of high risk pregnancy, unspecified, unspecified trimester: Secondary | ICD-10-CM

## 2015-10-28 DIAGNOSIS — O24319 Unspecified pre-existing diabetes mellitus in pregnancy, unspecified trimester: Secondary | ICD-10-CM

## 2015-10-28 MED ORDER — GLYBURIDE 2.5 MG PO TABS: 2.5000 mg | ORAL_TABLET | Freq: Two times a day (BID) | ORAL | Status: DC

## 2015-10-28 NOTE — Progress Notes (Signed)
Breastfeeding tip reviewed 

## 2015-10-28 NOTE — Progress Notes (Signed)
Subjective:  Alice BroachRosa Romero Velez is a 20 y.o. G1P0000 at 1934w4d being seen today for ongoing prenatal care.  She is currently monitored for the following issues for this high-risk pregnancy and has Supervision of high risk pregnancy, antepartum and Type F DM on her problem list.  Patient reports no complaints.   . Vag. Bleeding: None.  Movement: Present. Denies leaking of fluid.   fastings 100s to 140s. PPs 140s to 200s.  The following portions of the patient's history were reviewed and updated as appropriate: allergies, current medications, past family history, past medical history, past social history, past surgical history and problem list. Problem list updated.  Objective:   Filed Vitals:   10/28/15 0938  BP: 106/66  Pulse: 79  Weight: 134 lb 9.6 oz (61.054 kg)    Fetal Status: Fetal Heart Rate (bpm): 134   Movement: Present     General:  Alert, oriented and cooperative. Patient is in no acute distress.  Skin: Skin is warm and dry. No rash noted.   Cardiovascular: Normal heart rate noted  Respiratory: Normal respiratory effort, no problems with respiration noted  Abdomen: Soft, gravid, appropriate for gestational age. Pain/Pressure: Present     Pelvic: Vag. Bleeding: None Vag D/C Character: White   Cervical exam deferred        Extremities: Normal range of motion.  Edema: None  Mental Status: Normal mood and affect. Normal behavior. Normal judgment and thought content.   Urinalysis:      Assessment and Plan:  Pregnancy: G1P0000 at 7334w4d  # FDM - start glyburide 2.5 bid, hypoglycemia precautions discussed - seeing nutrition today - dm educator f/u one week, hrob 2 wks  There are no diagnoses linked to this encounter. Preterm labor symptoms and general obstetric precautions including but not limited to vaginal bleeding, contractions, leaking of fluid and fetal movement were reviewed in detail with the patient. Please refer to After Visit Summary for other counseling  recommendations.    Kathrynn RunningNoah Bedford Zackariah Vanderpol, MD

## 2015-10-28 NOTE — Progress Notes (Signed)
U/S scheduled for 06/20 @ 1030am.

## 2015-10-28 NOTE — Progress Notes (Signed)
Nutrition note: GDM diet education Pt saw DM educator last week & received a meter but had not met to discuss nutrition in detail; pt has been checking her BS- fasting: 103-149; 2hr pp: 139-270 (per MD note today, pt to start glyburide 2.5 bid).  Pt has gained 8.6# @ [redacted]w[redacted]d which is wnl. Pt reports eating 2-3 meals & 2 snacks/d. Pt is taking a PNV. Pt reports no N&V but has heartburn occ. NKFA Pt reports walking 15-30 mins 2 x/ wk. Pt received verbal & written education in Spanish via an interpreter about GDM diet. Encouraged ~30 mins of walking most days of the week. Discussed wt gain goals of 15-25# or 0.6#/wk.  Pt agrees to follow GDM diet with 3 meals & 1-3 snacks/d with proper CHO/ protein combination. Pt has WMilton& plans to BF. F/u in 4-6 wks LVladimir Faster MS, RD, LDN, [facility]

## 2015-11-01 ENCOUNTER — Encounter (HOSPITAL_COMMUNITY): Payer: Self-pay | Admitting: Obstetrics and Gynecology

## 2015-11-04 ENCOUNTER — Ambulatory Visit: Payer: Self-pay | Admitting: Dietician

## 2015-11-07 ENCOUNTER — Ambulatory Visit: Payer: Self-pay

## 2015-11-11 ENCOUNTER — Ambulatory Visit (INDEPENDENT_AMBULATORY_CARE_PROVIDER_SITE_OTHER): Payer: Self-pay | Admitting: Obstetrics and Gynecology

## 2015-11-11 ENCOUNTER — Encounter: Payer: Self-pay | Attending: Obstetrics and Gynecology | Admitting: Dietician

## 2015-11-11 VITALS — BP 102/67 | HR 70 | Temp 98.9°F | Wt 134.1 lb

## 2015-11-11 DIAGNOSIS — O0990 Supervision of high risk pregnancy, unspecified, unspecified trimester: Secondary | ICD-10-CM

## 2015-11-11 DIAGNOSIS — Z3A00 Weeks of gestation of pregnancy not specified: Secondary | ICD-10-CM | POA: Insufficient documentation

## 2015-11-11 DIAGNOSIS — O24419 Gestational diabetes mellitus in pregnancy, unspecified control: Secondary | ICD-10-CM

## 2015-11-11 LAB — POCT URINALYSIS DIP (DEVICE)
Bilirubin Urine: NEGATIVE
GLUCOSE, UA: NEGATIVE mg/dL
Ketones, ur: NEGATIVE mg/dL
Leukocytes, UA: NEGATIVE
Nitrite: NEGATIVE
PH: 7 (ref 5.0–8.0)
Protein, ur: 30 mg/dL — AB
Specific Gravity, Urine: 1.015 (ref 1.005–1.030)
UROBILINOGEN UA: 0.2 mg/dL (ref 0.0–1.0)

## 2015-11-11 MED ORDER — INSULIN ASPART 100 UNIT/ML ~~LOC~~ SOLN: 7.0000 [IU] | Freq: Three times a day (TID) | SUBCUTANEOUS | Status: DC

## 2015-11-11 MED ORDER — ALCOHOL PREP PADS: 1.0000 | MEDICATED_PAD | Freq: Every day | Status: DC

## 2015-11-11 MED ORDER — INSULIN NPH (HUMAN) (ISOPHANE) 100 UNIT/ML ~~LOC~~ SUSP: 20.0000 [IU] | Freq: Two times a day (BID) | SUBCUTANEOUS | Status: DC

## 2015-11-11 MED ORDER — "INSULIN SYRINGE-NEEDLE U-100 31G X 15/64"" 0.3 ML MISC": 1.0000 | Freq: Every day | Status: DC

## 2015-11-11 NOTE — Progress Notes (Signed)
Diabetes Education: 11/11/15 Orders received for insulin instruction.  Completed instruction per phone using [employer]. The interpreter was not always a through as I would prefer.  Provided an Insulin instruction kit that provided a photographic instruction for mixing insulins. We practiced mixing insulin and injecting the saline solution into a foam pad.  She did a return demonstration of the procedure. She is take NPH 20 units AM and PM and Novolog insulin 7 units pre-meals. MD is ordering Insulins, syringes and alcohol pads at her local pharmacy. Will plan to f/u at next clinic appointment. Maggie, Dorsie Burich, RN, RD. LDN

## 2015-11-11 NOTE — Addendum Note (Signed)
Addended by: [place] BingPICKENS, Meyer Arora on: 11/11/2015 01:20 PM   Modules accepted: Orders

## 2015-11-11 NOTE — Progress Notes (Signed)
Subjective:  [name] is a 20 y.o. G1P0000 at 6357w4d being seen today for ongoing prenatal care.  She is currently monitored for the following issues for this high-risk pregnancy and has Supervision of high risk pregnancy, antepartum and GDM, class A2 on her problem list.  Patient reports no complaints.   Contractions: Not present. Vag. Bleeding: None.  Movement: Present. Denies leaking of fluid.   The following portions of the patient's history were reviewed and updated as appropriate: allergies, current medications, past family history, past medical history, past social history, past surgical history and problem list. Problem list updated.  Objective:   Filed Vitals:   11/11/15 1116  BP: 102/67  Pulse: 70  Temp: 98.9 F (37.2 C)  Weight: 134 lb 1.6 oz (60.827 kg)    Fetal Status: Fetal Heart Rate (bpm): 150   Movement: Present     General:  Alert, oriented and cooperative. Patient is in no acute distress.  Skin: Skin is warm and dry. No rash noted.   Cardiovascular: Normal heart rate noted  Respiratory: Normal respiratory effort, no problems with respiration noted  Abdomen: Soft, gravid, appropriate for gestational age. Pain/Pressure: Present     Pelvic:  Cervical exam deferred        Extremities: Normal range of motion.  Edema: None  Mental Status: Normal mood and affect. Normal behavior. Normal judgment and thought content.   Urinalysis:      Assessment and Plan:  Pregnancy: G1P0000 at 1757w4d  1. Supervision of high risk pregnancy, antepartum, unspecified trimester Routine care.  2. GDM, class A2 Patient on glyburide but with poor control with log book review.  AM fastings 80s-120s 2hr PPs 130s-200 to 300s sometimes Will start on insulin NPH 20/20 and short acting 7/7/7. Pt to have DM teaching today RTC 1wk to review log Will need growth scan at 24-28wks  Preterm labor symptoms and general obstetric precautions including but not limited to vaginal bleeding,  contractions, leaking of fluid and fetal movement were reviewed in detail with the patient. Please refer to After Visit Summary for other counseling recommendations.  Return in about 1 week (around 11/18/2015).   [place] Bingharlie Mysty Kielty, MD

## 2015-11-11 NOTE — Addendum Note (Signed)
Addended by: Kathee DeltonHILLMAN, Jolly Carlini L on: 11/11/2015 03:53 PM   Modules accepted: Orders

## 2015-11-11 NOTE — Addendum Note (Signed)
Addended by: [place] BingPICKENS, Shye Doty on: 11/11/2015 12:47 PM   Modules accepted: Orders, Medications

## 2015-11-12 ENCOUNTER — Ambulatory Visit (HOSPITAL_COMMUNITY)
Admission: RE | Admit: 2015-11-12 | Discharge: 2015-11-12 | Disposition: A | Discharge status: Home / Self Care | Payer: Self-pay | Source: Ambulatory Visit | Attending: Obstetrics and Gynecology | Admitting: Obstetrics and Gynecology

## 2015-11-12 ENCOUNTER — Other Ambulatory Visit: Payer: Self-pay | Admitting: Obstetrics and Gynecology

## 2015-11-12 ENCOUNTER — Encounter (HOSPITAL_COMMUNITY): Payer: Self-pay

## 2015-11-12 VITALS — BP 109/65 | HR 86 | Wt 135.0 lb

## 2015-11-12 DIAGNOSIS — Z3A24 24 weeks gestation of pregnancy: Secondary | ICD-10-CM | POA: Insufficient documentation

## 2015-11-12 DIAGNOSIS — O24319 Unspecified pre-existing diabetes mellitus in pregnancy, unspecified trimester: Secondary | ICD-10-CM

## 2015-11-12 DIAGNOSIS — Z1389 Encounter for screening for other disorder: Secondary | ICD-10-CM

## 2015-11-12 DIAGNOSIS — O24312 Unspecified pre-existing diabetes mellitus in pregnancy, second trimester: Secondary | ICD-10-CM | POA: Insufficient documentation

## 2015-11-12 DIAGNOSIS — O24419 Gestational diabetes mellitus in pregnancy, unspecified control: Secondary | ICD-10-CM

## 2015-11-12 DIAGNOSIS — Z36 Encounter for antenatal screening of mother: Secondary | ICD-10-CM | POA: Insufficient documentation

## 2015-11-13 ENCOUNTER — Telehealth: Payer: Self-pay

## 2015-11-13 MED ORDER — INSULIN REGULAR HUMAN 100 UNIT/ML IJ SOLN: 7.0000 [IU] | Freq: Three times a day (TID) | INTRAMUSCULAR | Status: DC

## 2015-11-13 MED ORDER — INSULIN ASPART 100 UNIT/ML ~~LOC~~ SOLN: 7.0000 [IU] | Freq: Three times a day (TID) | SUBCUTANEOUS | Status: DC

## 2015-11-13 NOTE — Telephone Encounter (Signed)
Pt called the front desk and with Alice CahillSusan Velez interpreting pt informed me that she went to her pharmacy to pick up her insulin and was told that her Regular insulin was going to cost to much.  I called pt's pharmacy [facility] [address]  and asked if they had another insulin that was more affordable.  I was advised by pharmacist that she could get a vial of Novolin R for $28.  Confirmed with [name], CNM that Novolog is comparable to Novolin R.  Pt stated that she was able to afford that medication and would go to the pharmacy to pick up.

## 2015-11-18 ENCOUNTER — Encounter: Payer: Self-pay | Admitting: Obstetrics and Gynecology

## 2015-11-18 ENCOUNTER — Ambulatory Visit: Payer: Self-pay

## 2015-11-18 ENCOUNTER — Ambulatory Visit (INDEPENDENT_AMBULATORY_CARE_PROVIDER_SITE_OTHER): Payer: Self-pay | Admitting: Obstetrics and Gynecology

## 2015-11-18 VITALS — BP 105/48 | HR 69 | Wt 139.7 lb

## 2015-11-18 DIAGNOSIS — O24419 Gestational diabetes mellitus in pregnancy, unspecified control: Secondary | ICD-10-CM

## 2015-11-18 DIAGNOSIS — O0992 Supervision of high risk pregnancy, unspecified, second trimester: Secondary | ICD-10-CM

## 2015-11-18 LAB — POCT URINALYSIS DIP (DEVICE)
Bilirubin Urine: NEGATIVE
Glucose, UA: NEGATIVE mg/dL
KETONES UR: NEGATIVE mg/dL
Leukocytes, UA: NEGATIVE
Nitrite: NEGATIVE
PH: 7 (ref 5.0–8.0)
PROTEIN: NEGATIVE mg/dL
SPECIFIC GRAVITY, URINE: 1.015 (ref 1.005–1.030)
Urobilinogen, UA: 0.2 mg/dL (ref 0.0–1.0)

## 2015-11-18 NOTE — Progress Notes (Signed)
Needs to see Diabetes Educator

## 2015-11-18 NOTE — Progress Notes (Signed)
Subjective:  [name] is a 20 y.o. G1P0000 at 1853w4d being seen today for ongoing prenatal care.  She is currently monitored for the following issues for this high-risk pregnancy and has Supervision of high risk pregnancy, antepartum and GDM, class A2/B on her problem list.  Patient reports no complaints.  Contractions: Not present. Vag. Bleeding: None.  Movement: Present. Denies leaking of fluid.   The following portions of the patient's history were reviewed and updated as appropriate: allergies, current medications, past family history, past medical history, past social history, past surgical history and problem list. Problem list updated.  Objective:   Filed Vitals:   11/18/15 1119  BP: 105/48  Pulse: 69  Weight: 139 lb 11.2 oz (63.368 kg)    Fetal Status: Fetal Heart Rate (bpm): 150 Fundal Height: 26 cm Movement: Present     General:  Alert, oriented and cooperative. Patient is in no acute distress.  Skin: Skin is warm and dry. No rash noted.   Cardiovascular: Normal heart rate noted  Respiratory: Normal respiratory effort, no problems with respiration noted  Abdomen: Soft, gravid, appropriate for gestational age. Pain/Pressure: Present     Pelvic: Cervical exam deferred        Extremities: Normal range of motion.  Edema: None  Mental Status: Normal mood and affect. Normal behavior. Normal judgment and thought content.   Urinalysis:      Assessment and Plan:  Pregnancy: G1P0000 at 4253w4d  1. GDM, class A2 Patient has been taking her insulin as instructed but stopped checking her CBGs as she did not think it was necessary. She thought taking insulin is all she needed to do Instructed patient to continue checking CBGs 4 times daily and to bring log and meter with her at every appointment Patient is also concerned about the cost of her insulin as the pharmacy told her that it will cost her 400$. Advised her to follow up with health department to obtain status on her  medicaid and to save receipts for reimbursement  2. Supervision of high risk pregnancy, antepartum, second trimester Patient has follow up growth ultrasound  Preterm labor symptoms and general obstetric precautions including but not limited to vaginal bleeding, contractions, leaking of fluid and fetal movement were reviewed in detail with the patient. Please refer to After Visit Summary for other counseling recommendations.  No Follow-up on file.   Catalina AntiguaPeggy Jayron Maqueda, MD

## 2015-12-09 ENCOUNTER — Encounter: Payer: Self-pay | Admitting: Obstetrics and Gynecology

## 2015-12-09 ENCOUNTER — Ambulatory Visit (INDEPENDENT_AMBULATORY_CARE_PROVIDER_SITE_OTHER): Payer: Self-pay | Admitting: Family Medicine

## 2015-12-09 VITALS — BP 96/60 | HR 88 | Wt 142.0 lb

## 2015-12-09 DIAGNOSIS — Z23 Encounter for immunization: Secondary | ICD-10-CM

## 2015-12-09 DIAGNOSIS — O24414 Gestational diabetes mellitus in pregnancy, insulin controlled: Secondary | ICD-10-CM

## 2015-12-09 DIAGNOSIS — O0993 Supervision of high risk pregnancy, unspecified, third trimester: Secondary | ICD-10-CM

## 2015-12-09 LAB — CBC
HCT: 37 % (ref 35.0–45.0)
Hemoglobin: 12.3 g/dL (ref 11.7–15.5)
MCH: 29.3 pg (ref 27.0–33.0)
MCHC: 33.2 g/dL (ref 32.0–36.0)
MCV: 88.1 fL (ref 80.0–100.0)
MPV: 9.2 fL (ref 7.5–12.5)
PLATELETS: 352 10*3/uL (ref 140–400)
RBC: 4.2 MIL/uL (ref 3.80–5.10)
RDW: 14.3 % (ref 11.0–15.0)
WBC: 11.8 10*3/uL — AB (ref 3.8–10.8)

## 2015-12-09 LAB — POCT URINALYSIS DIP (DEVICE)
Bilirubin Urine: NEGATIVE
GLUCOSE, UA: NEGATIVE mg/dL
Ketones, ur: NEGATIVE mg/dL
Nitrite: NEGATIVE
PH: 7 (ref 5.0–8.0)
PROTEIN: NEGATIVE mg/dL
Specific Gravity, Urine: 1.015 (ref 1.005–1.030)
UROBILINOGEN UA: 0.2 mg/dL (ref 0.0–1.0)

## 2015-12-09 LAB — HIV ANTIBODY (ROUTINE TESTING W REFLEX): HIV: NONREACTIVE

## 2015-12-09 MED ORDER — DOXYLAMINE SUCCINATE (SLEEP) 25 MG PO TABS: 25.0000 mg | ORAL_TABLET | Freq: Every evening | ORAL | Status: DC | PRN

## 2015-12-09 MED ORDER — INSULIN NPH (HUMAN) (ISOPHANE) 100 UNIT/ML ~~LOC~~ SUSP: 20.0000 [IU] | SUBCUTANEOUS | Status: DC

## 2015-12-09 MED ORDER — INSULIN REGULAR HUMAN 100 UNIT/ML IJ SOLN: 6.0000 [IU] | INTRAMUSCULAR | Status: DC

## 2015-12-09 NOTE — Progress Notes (Signed)
Subjective:  Don BroachRosa Romero Vasquez is a 20 y.o. G1P0000 at 5832w4d being seen today for ongoing prenatal care.  She is currently monitored for the following issues for this high-risk pregnancy and has Supervision of high risk pregnancy, antepartum and GDM, class A2/B on her problem list.  GDM: Patient taking NPH 20 u twice a day, insulin R 3 units 3 times a day .  Reports hypoglycemic episodes in the morning.  Tolerating medication well Fasting: 56 - 104 (4 elevated) 2hr PP: 91-191 - mostly elevated after breakfast and dinner  Patient reports no complaints.  Contractions: Not present. Vag. Bleeding: None.  Movement: Present. Denies leaking of fluid.   The following portions of the patient's history were reviewed and updated as appropriate: allergies, current medications, past family history, past medical history, past social history, past surgical history and problem list. Problem list updated.  Objective:   Filed Vitals:   12/09/15 1131  BP: 96/60  Pulse: 88  Weight: 142 lb (64.411 kg)    Fetal Status: Fetal Heart Rate (bpm): 137   Movement: Present     General:  Alert, oriented and cooperative. Patient is in no acute distress.  Skin: Skin is warm and dry. No rash noted.   Cardiovascular: Normal heart rate noted  Respiratory: Normal respiratory effort, no problems with respiration noted  Abdomen: Soft, gravid, appropriate for gestational age. Pain/Pressure: Present     Pelvic: Vag. Bleeding: None Vag D/C Character: White   Cervical exam deferred        Extremities: Normal range of motion.  Edema: None  Mental Status: Normal mood and affect. Normal behavior. Normal judgment and thought content.   Urinalysis:      Assessment and Plan:  Pregnancy: G1P0000 at 2732w4d  1. Supervision of high risk pregnancy, antepartum, third trimester FHT normal  2. Insulin controlled gestational diabetes mellitus in third trimester Change:  NPH 26 units QAM, 10 Units QHS R 10 units QAM and 6 units  with dinner - CBC - RPR - HIV antibody (with reflex) - Tdap vaccine greater than or equal to 7yo IM; Standing - Tdap vaccine greater than or equal to 7yo IM  Preterm labor symptoms and general obstetric precautions including but not limited to vaginal bleeding, contractions, leaking of fluid and fetal movement were reviewed in detail with the patient. Please refer to After Visit Summary for other counseling recommendations.  Return in about 2 weeks (around 12/23/2015) for HR OB f/u.   Levie HeritageJacob J Evia Goldsmith, DO

## 2015-12-09 NOTE — Progress Notes (Signed)
Okey Regalarol used for interpreter Breastfeeding discussed with patient

## 2015-12-09 NOTE — Patient Instructions (Signed)
Insulina:  Insulina NPH antes del desayuno: 24 unidades Insulina R antes del desayuno: 10 unidades Insulina R antes de la cena: 6 unidades Insulina NPH antes de acostarse: 10 unidades

## 2015-12-10 ENCOUNTER — Encounter (HOSPITAL_COMMUNITY): Payer: Self-pay

## 2015-12-10 ENCOUNTER — Ambulatory Visit (HOSPITAL_COMMUNITY)
Admission: RE | Admit: 2015-12-10 | Discharge: 2015-12-10 | Disposition: A | Discharge status: Home / Self Care | Payer: Self-pay | Source: Ambulatory Visit | Attending: Obstetrics and Gynecology | Admitting: Obstetrics and Gynecology

## 2015-12-10 ENCOUNTER — Other Ambulatory Visit (HOSPITAL_COMMUNITY): Payer: Self-pay | Admitting: Obstetrics and Gynecology

## 2015-12-10 DIAGNOSIS — O24313 Unspecified pre-existing diabetes mellitus in pregnancy, third trimester: Secondary | ICD-10-CM | POA: Insufficient documentation

## 2015-12-10 DIAGNOSIS — Z3A28 28 weeks gestation of pregnancy: Secondary | ICD-10-CM | POA: Insufficient documentation

## 2015-12-10 DIAGNOSIS — Z36 Encounter for antenatal screening of mother: Secondary | ICD-10-CM | POA: Insufficient documentation

## 2015-12-10 DIAGNOSIS — O24419 Gestational diabetes mellitus in pregnancy, unspecified control: Secondary | ICD-10-CM

## 2015-12-10 DIAGNOSIS — O24913 Unspecified diabetes mellitus in pregnancy, third trimester: Secondary | ICD-10-CM

## 2015-12-10 DIAGNOSIS — Z0489 Encounter for examination and observation for other specified reasons: Secondary | ICD-10-CM

## 2015-12-10 DIAGNOSIS — O0993 Supervision of high risk pregnancy, unspecified, third trimester: Secondary | ICD-10-CM

## 2015-12-10 DIAGNOSIS — IMO0002 Reserved for concepts with insufficient information to code with codable children: Secondary | ICD-10-CM

## 2015-12-10 LAB — RPR

## 2015-12-16 ENCOUNTER — Ambulatory Visit: Payer: Self-pay | Admitting: *Deleted

## 2015-12-16 ENCOUNTER — Ambulatory Visit: Payer: Self-pay | Admitting: Dietician

## 2015-12-16 DIAGNOSIS — O24419 Gestational diabetes mellitus in pregnancy, unspecified control: Secondary | ICD-10-CM

## 2015-12-16 NOTE — Progress Notes (Signed)
Alice Velez is seen with the assistance of the Spanish interpreter.   Has C/O having low blood glucose levels in the early morning at 4-5:00 am. Reports that dinner is at 9:00 pm each evening and she is taking the NPH and the regular at 9:00 and the NPH at 10:00 pm or bed time. Fasting 91-96-103-150 Primarily in 90's. PC Breakfast: 140-115-92-185-140-162. PC Lunch: [phone] PC Dinner: 145, 157, 184, 173,162  Has received new insulin orders. NPH: 26 units in AM and 10 units at HS. Regular: 10 units in AM and 6 units at dinner.  Recommendations: Take the regular insulin 10 units with the the NPH 30 minutes before breakfast.  Does not want to mix the insulin, prefers to take separate injections.   Move the dinner meal to 7:00 pm and take the 6 units of regular insulin 30 minutes prior to the evening meal.  Check glucose at 9:00 pm and have a snack and then take the NPH at 10 pm.  Aim for a consistent schedule.  Completed review of the diet and carb counting.  Provided her with a Spanish Carb Counting Card.  Would like to see her if this does not help with the hypoglycemic episodes and the elevated post meal levels.  Maggie Ed Rayson, RN, RD, LDN

## 2015-12-23 ENCOUNTER — Ambulatory Visit (INDEPENDENT_AMBULATORY_CARE_PROVIDER_SITE_OTHER): Payer: Self-pay | Admitting: Advanced Practice Midwife

## 2015-12-23 ENCOUNTER — Encounter: Payer: Self-pay | Attending: Obstetrics and Gynecology | Admitting: Dietician

## 2015-12-23 DIAGNOSIS — O9989 Other specified diseases and conditions complicating pregnancy, childbirth and the puerperium: Secondary | ICD-10-CM

## 2015-12-23 DIAGNOSIS — Z3A00 Weeks of gestation of pregnancy not specified: Secondary | ICD-10-CM | POA: Insufficient documentation

## 2015-12-23 DIAGNOSIS — R109 Unspecified abdominal pain: Secondary | ICD-10-CM

## 2015-12-23 DIAGNOSIS — O26899 Other specified pregnancy related conditions, unspecified trimester: Secondary | ICD-10-CM

## 2015-12-23 DIAGNOSIS — O24419 Gestational diabetes mellitus in pregnancy, unspecified control: Secondary | ICD-10-CM | POA: Insufficient documentation

## 2015-12-23 LAB — POCT URINALYSIS DIP (DEVICE)
Bilirubin Urine: NEGATIVE
Glucose, UA: NEGATIVE mg/dL
Ketones, ur: NEGATIVE mg/dL
NITRITE: NEGATIVE
Protein, ur: NEGATIVE mg/dL
Specific Gravity, Urine: 1.02 (ref 1.005–1.030)
UROBILINOGEN UA: 0.2 mg/dL (ref 0.0–1.0)
pH: 7 (ref 5.0–8.0)

## 2015-12-23 MED ORDER — RANITIDINE HCL 150 MG PO TABS: 150.0000 mg | ORAL_TABLET | Freq: Two times a day (BID) | ORAL | 0 refills | Status: DC

## 2015-12-23 NOTE — Patient Instructions (Signed)
Acidez de estmago durante el embarazo  (Heartburn During Pregnancy) La acidez es la sensacin de ardor en el pecho que se siente cuando el cido del estmago vuelve haca el esfago. La acidez es frecuente en el embarazo debido a la liberacin de cierta hormona (progesterona). La progesterona relaja la vlvula que separa el esfago del [place]. Esto hace que el cido suba al esfago y cause acidez. Tambin puede ocurrir Visual merchandiser debido a que el tero al agrandarse empuja el estmago, lo que hace que suba ms cido al esfago. Esto se produce especialmente en las ltimas etapas del embarazo. La acidez generalmente desaparece despus del parto. CAUSAS  La acidez se siente cuando el cido del estmago vuelve hacia el esfago. Durante el [place], puede ser causada por distintas cosas, por ejemplo:   La hormona progesterona.  Cambios en los niveles hormonales.  El tero crece y [address] cido del estmago [place].  Comidas abundantes  Ciertos alimentos y [address] fsica.  Aumento en la produccin de cido SIGNOS Y SNTOMAS   Sensacin de ardor en el pecho o en la parte inferior de la garganta.  Sabor amargo en la boca.  Tos. DIAGNSTICO  El mdico diagnostica la [place] con una historia clnica cuidadosa en la que pregunta por sus molestias. Le indicar anlisis de sangre para Clinical research associate cierto tipo de bacteria que se asocia con la [place]. En algunos casos se diagnostica recetando un medicamento para calmar la acidez y viendo si los sntomas mejoran. En algunos casos, se realiza un procedimiento llamado endoscopa. En este procedimiento se [place] un tubo con Neomia Dear luz y Posey Boyer en un extremo (endoscopio) , y se examina el esfago y Investment banker, corporate. TRATAMIENTO  El tratamiento variar segn la gravedad de los sntomas. El mdico podr indicar:  Medicamentos de Sales promotion account executive (anticidos, medicamentos para Conservator, museum/gallery Engineering geologist) en los casos de acidez leve.  Medicamentos  recetados para disminuir el cido estomacal o para proteger la superficie del [place].  Ciertos cambios en la dieta.  Elevacin de la cabecera de la cama colocando bloques debajo de las patas. De esta manera evitar que el cido del estmago vuelva al esfago mientras est recostado. INSTRUCCIONES PARA EL CUIDADO EN EL HOGAR   Tome slo medicamentos de venta libre o recetados, segn las indicaciones del mdico.  Eleve la cabecera de la cama colocando bloques debajo de las patas, si el mdico lo aconsej. Usar ms almohadas al dormir no es Secretary/administrator porque solo modificara la posicin de la cabeza.  No  haga ejercicios enseguida despus de comer.  Evite comer 2 o 3horas antes de irse a dormir. No se acueste enseguida despus de comer.  Haga comidas pequeas Freight forwarder de tres comidas abundantes.  Identifique los alimentos o las bebidas que empeoran sus sntomas y evtelos. Los alimentos que debe evitar son:  [place].  Chocolate.  Alimentos con alto contenido de grasas, incluyendo las comidas fritas  Comidas muy condimentadas.  Ajo y [address]  Ctricos, como naranja, pomelo, limn y lima  Alimentos o productos que contengan tomate  Menta.  Bebidas gaseosas y con cafena.  [place]agre SOLICITE ATENCIN MDICA SI:  Tiene cualquier tipo de dolor abdominal.  Siente ardor en la parte superior del abdomen o el pecho, especialmente despus de comer o mientras est acostada.  Tiene nuseas o vmitos.  Siente malestar estomacal despus de comer. SOLICITE ATENCIN MDICA DE INMEDIATO SI:   Siente un dolor intenso en el pecho que  baja por el brazo o va hacia al mandbula o el cuello.  Se siente mareado o sufre un desmayo.  Comienza a sentir falta de aire.  Vomita sangre.  Tiene dificultad o dolor al tragar.  La materia fecal es negra, de aspecto alquitranado.  Tiene acidez ms de 3 veces por semana, durante ms de 2 semanas. ASEGRESE DE QUE:  Comprende  estas instrucciones.  Controlar su afeccin.  Recibir ayuda de inmediato si no mejora o si empeora.   Esta informacin no tiene Theme park manager el consejo del mdico. Asegrese de hacerle al mdico cualquier pregunta que tenga.   Document Released: 02/18/2005 Document Revised: 06/01/2014 Elsevier Interactive Patient Education [employer]

## 2015-12-23 NOTE — Progress Notes (Signed)
Upper abdom pain, since pre-preg, intermittent, worse when stomach empty, better with food Always on right side , no n/v  Zantac bid      Subjective:  Alice Velez is a 20 y.o. G1P0000 at [redacted]w[redacted]d being seen today for ongoing prenatal care.  She is currently monitored for the following issues for this high-risk pregnancy and has Supervision of high risk pregnancy, antepartum and GDM, class A2/B on her problem list.  Patient reports upper abdominal pain, intermittent that started before the pregnancy and continues but is not worse now..   .  .   . Denies leaking of fluid.   The following portions of the patient's history were reviewed and updated as appropriate: allergies, current medications, past family history, past medical history, past social history, past surgical history and problem list. Problem list updated.  Objective:  There were no vitals filed for this visit.  Fetal Status:           General:  Alert, oriented and cooperative. Patient is in no acute distress.  Skin: Skin is warm and dry. No rash noted.   Cardiovascular: Normal heart rate noted  Respiratory: Normal respiratory effort, no problems with respiration noted  Abdomen: Soft, gravid, appropriate for gestational age.       Pelvic:  Cervical exam deferred        Extremities: Normal range of motion.     Mental Status: Normal mood and affect. Normal behavior. Normal judgment and thought content.   Urinalysis:      Assessment and Plan:  Pregnancy: G1P0000 at [redacted]w[redacted]d  1. Gestational diabetes mellitus, unspecified diabetic control, unspecified trimester --Reviewed glucose log (Fastings 87-105, PP 136-205, all above normal range) --Pt to see diabetic educator in office today for change in medications/diet.  2. Abdominal pain complicating pregnancy --Upper abdominal pain that is intermittent, worse on empty stomach.  No n/v, no fever/chills, no acute abdomen. --Treat for acid reflux with Zantac 150 mg BID,  reevaluate at next visit.  Preterm labor symptoms and general obstetric precautions including but not limited to vaginal bleeding, contractions, leaking of fluid and fetal movement were reviewed in detail with the patient. Please refer to After Visit Summary for other counseling recommendations.  Return in about 2 weeks (around 01/06/2016).   Hurshel Party, CNM

## 2015-12-23 NOTE — Progress Notes (Signed)
Diabetes Education:  12/23/15 Was ask to f/u again today to clarify her insulin dosage and diet review. Insulin dosages: NPH: 26 units in AM before breakfast. NPH: 10 units at bedtime. Regular: 10 units before breakfast. Regular 6 units prior to dinner meal.  Review of the injection process.  She ask to not mix the insulin and to do 2 shots in the morning.  Glucose readings: Fasting: 90, 111, 87, 97, 107,, 105, 101, 94. (notes that she ate less the night before and had the 87 the following morning). 2 hr post breakfast: 131, 147, 147, 122, 88, 158. 141 2 hr post lunch: 181, 200, 170, 161, 161, 136. 2 hr post dinner: 161, 175, 192, 162, 114, 150, 184.  I am not sure she has been taking her insulin as was ordered.  Discussed technique.  Reviewed the carbohydrate foods and the need to monitor portions, to have 3 smaller meals and have a snack.  Will try to f/u with her at next clinic visit.   Maggie Lanecia Sliva, RN, RD, LDN

## 2015-12-30 ENCOUNTER — Ambulatory Visit (INDEPENDENT_AMBULATORY_CARE_PROVIDER_SITE_OTHER): Payer: Self-pay | Admitting: Family Medicine

## 2015-12-30 ENCOUNTER — Encounter: Payer: Self-pay | Admitting: Family Medicine

## 2015-12-30 VITALS — BP 114/72 | HR 87 | Wt 150.3 lb

## 2015-12-30 DIAGNOSIS — O0993 Supervision of high risk pregnancy, unspecified, third trimester: Secondary | ICD-10-CM

## 2015-12-30 DIAGNOSIS — O24419 Gestational diabetes mellitus in pregnancy, unspecified control: Secondary | ICD-10-CM

## 2015-12-30 LAB — POCT URINALYSIS DIP (DEVICE)
Bilirubin Urine: NEGATIVE
GLUCOSE, UA: 500 mg/dL — AB
Ketones, ur: NEGATIVE mg/dL
NITRITE: NEGATIVE
PROTEIN: NEGATIVE mg/dL
SPECIFIC GRAVITY, URINE: 1.015 (ref 1.005–1.030)
UROBILINOGEN UA: 0.2 mg/dL (ref 0.0–1.0)
pH: 7.5 (ref 5.0–8.0)

## 2015-12-30 NOTE — Patient Instructions (Signed)
Tercer trimestre de Psychiatrist (Third Trimester of Pregnancy) El tercer trimestre comprende desde la semana29 hasta la semana42, es decir, desde el mes7 hasta el [address] En este trimestre, el feto crece muy rpido. Hacia el final del noveno mes, el feto mide alrededor de 20pulgadas (45cm) de largo y pesa entre 6y 10libras [phone]).  CUIDADOS EN EL HOGAR   No fume, no consuma hierbas ni beba alcohol. No tome frmacos que el mdico no haya autorizado.  No consuma ningn producto que contenga tabaco, lo que incluye cigarrillos, tabaco de Theatre manager o Administrator, Civil Service. Si necesita ayuda para dejar de fumar, consulte al [employer]. Puede recibir asesoramiento u otro tipo de apoyo para dejar de fumar.  Tome los medicamentos solamente como se lo haya indicado el mdico. Algunos medicamentos son seguros para tomar durante el Psychiatrist y otros no lo son.  Haga ejercicios solamente como se lo haya indicado el mdico. Interrumpa la actividad fsica si comienza a tener calambres.  Ingiera alimentos saludables de [place] regular.  Use un sostn que le brinde buen soporte si sus mamas estn sensibles.  No se d baos de inmersin en agua caliente, baos turcos ni saunas.  Colquese el cinturn de seguridad cuando conduzca.  No coma carne cruda ni queso sin cocinar; evite el contacto con las bandejas sanitarias de los gatos y la tierra que estos animales usan.  Tome las vitaminas prenatales.  Tome entre 1500 y  de calcio diariamente comenzando en la semana20 del embarazo [place].  Pruebe tomar un medicamento que la ayude a defecar (un laxante suave) si el mdico lo autoriza. Consuma ms fibra, que se encuentra en las frutas y verduras frescas y los cereales integrales. Beba suficiente lquido para mantener el pis (orina) claro o de color amarillo plido.  Dese baos de asiento con agua tibia para Engineer, materials o las molestias causadas por las hemorroides. Use una crema  para las hemorroides si el mdico la autoriza.  Si se le hinchan las venas (venas varicosas), use medias de descanso. Levante (eleve) los pies durante , 3 o 4veces por Futures trader. Limite el consumo de sal en su dieta.  No levante objetos pesados, use zapatos de tacones bajos y sintese derecha.  Descanse con las piernas elevadas si tiene calambres o dolor de cintura.  Visite a su dentista si no lo ha Occupational hygienist. Use un cepillo de cerdas suaves para cepillarse los dientes. Psese el hilo dental con suavidad.  Puede seguir [employer], a menos que el mdico le indique lo contrario.  No haga viajes de larga distancia, excepto si es obligatorio y solamente con la aprobacin del mdico.  Tome clases prenatales.  Practique ir manejando al hospital.  Prepare el bolso que llevar al hospital.  Prepare la habitacin del beb.  Concurra a los controles mdicos. SOLICITE AYUDA SI:  No est segura de si est en trabajo de parto o si ha roto la bolsa de las aguas.  Tiene mareos.  Siente calambres leves o presin en la parte inferior del abdomen.  Sufre un dolor persistente en el abdomen.  Tiene Programme researcher, broadcasting/film/video (nuseas), vmitos, o tiene deposiciones acuosas (diarrea).  Advierte un olor ftido que proviene de la vagina.  Siente dolor al [employer]. SOLICITE AYUDA DE INMEDIATO SI:   Tiene fiebre.  Tiene una prdida de lquido por la vagina.  Tiene sangrado o pequeas prdidas vaginales.  Siente dolor intenso o clicos en el abdomen.  Sube o baja de peso rpidamente.  Tiene dificultades para recuperar el aliento y siente dolor en el pecho.  Sbitamente se le hinchan mucho el rostro, las Pauline, los tobillos, los pies o las piernas.  No ha sentido los movimientos del beb durante Georgianne Fick.  Siente un dolor de cabeza intenso que no se alivia con medicamentos.  Su visin se modifica.   Esta informacin no tiene Theme park manager el  consejo del mdico. Asegrese de hacerle al mdico cualquier pregunta que tenga.   Document Released: 01/11/2013 Document Revised: 06/01/2014 Elsevier Interactive Patient Education 2016 [employer].   Diabetes mellitus gestacional (Gestational Diabetes Mellitus) La diabetes mellitus gestacional, ms comnmente conocida como diabetes gestacional es un tipo de diabetes que desarrollan algunas mujeres durante el [place]. En la diabetes gestacional, el pncreas no produce suficiente insulina (una hormona) o las clulas son menos sensibles a la insulina producida (resistencia a la insulina), o ambas cosas. Normalmente, la [employer] azcares de los alimentos a las clulas de los tejidos. Las clulas de los tejidos [employer] azcares para Psychiatrist. La falta de insulina o la falta de una respuesta normal a la insulina hace que el exceso de azcar se acumule en la sangre en lugar de Customer service manager en las clulas de los tejidos. Como resultado, se producen niveles altos de Banker (hiperglucemia). El [address] de los niveles altos de International aid/development worker (glucosa) puede causar muchos problemas.  FACTORES DE RIESGO Usted tiene mayor probabilidad de desarrollar diabetes gestacional si tiene antecedentes familiares de diabetes y tambin si tiene uno o ms de los siguientes factores de riesgo:  ndice de masa corporal superior a 30 (obesidad).  Embarazo previo con diabetes gestacional.  La edad avanzada en el momento del embarazo. Si se mantienen los niveles de glucosa en la sangre en un rango normal durante el [place], las mujeres pueden tener un embarazo saludable. Si los niveles de glucosa en la sangre no estn bien controlados, puede haber riesgos para usted, el feto o el recin nacido, o durante el [place] de parto y [place].  SNTOMAS  Si se presentan sntomas, stos son similares a los sntomas que normalmente experimentar durante el embarazo. Los sntomas de la diabetes gestacional son:     Wyvonnia Dusky de la sed (polidipsia).  Aumento de la miccin (poliuria).  Orina con ms frecuencia durante la noche (nocturia).  Prdida de peso. La prdida de peso puede ser muy rpida.  Infecciones frecuentes y recurrentes.  Cansancio (fatiga).  Debilidad.  Cambios en la visin, como visin borrosa.  Olor a Water quality scientist.  Dolor abdominal. DIAGNSTICO La diabetes se diagnostica cuando hay aumento de los niveles de glucosa en la [place]. El nivel de glucosa en la sangre puede controlarse en uno o ms de los siguientes anlisis de sangre:  Medicin de glucosa en la sangre en [place]. No se le permitir comer durante al menos 8 horas antes de que se tome [place] de [place].  Pruebas al azar de glucosa en la sangre. El nivel de glucosa en la sangre se controla en cualquier momento del da sin importar el momento en que haya comido.  Prueba de tolerancia a la glucosa oral (PTGO). La glucosa en la sangre se mide despus de no haber comido (ayunas) durante una a tres horas y despus de beber una bebida que contenga glucosa. Dado que las hormonas que causan la resistencia a la insulina son ms altas alrededor [employer] Financial 24 a 28 de Psychiatrist, generalmente se Systems developer PTGO  durante ese tiempo. Si tiene factores de riesgo, en la primera visita prenatal pueden hacerle pruebas de deteccin de diabetes tipo 2 no diagnosticada. TRATAMIENTO  La diabetes gestacional debe controlarse en primer lugar con dieta y ejercicios. Pueden agregarse medicamentos, pero solo si son necesarios.  Usted tendr que tomar medicamentos para la diabetes o insulina diariamente para Pharmacologist los niveles de glucosa en la sangre en el rango deseado.  Usted tendr American Express dosis de insulina con la actividad fsica y la eleccin de alimentos saludables. Si tiene diabetes gestacional, el objetivo del tratamiento ser [employer] siguientes niveles sanguneos de glucosa:  Antes de las comidas  (preprandial): valor de 95 mg/dl o inferior.  Despus de las comidas (posprandial):  Una hora despus de la comida: valor de 140 mg/dl o inferior.  Dos horas despus de la comida: valor de 120 mg/dl o inferior. Si tiene diabetes tipo 1 o tipo 2 preexistente, el objetivo del tratamiento ser [employer] siguientes niveles sanguneos de glucosa:  Antes de las comidas, a la hora de acostarse y durante la noche: de 60 a 99 mg/dl.  Despus de las comidas: valor mximo de 100 a 129 mg/dl. INSTRUCCIONES PARA EL CUIDADO EN EL HOGAR   Controle su nivel de hemoglobina A1c dos veces al ao.  Contrlese a diario el nivel de glucosa en la sangre segn las indicaciones de su mdico. Es comn Education officer, environmental controles frecuentes de la glucosa en la [place].  Supervise las cetonas en la orina cuando est enferma y segn las indicaciones de su mdico.  Tome el medicamento para la diabetes y adminstrese insulina segn las indicaciones de su mdico para Radio producer nivel de glucosa en la sangre en el rango deseado.  Nunca se quede sin medicamento para la diabetes o sin insulina. Es necesario que la reciba [employer].  Ajuste la insulina segn la ingesta de hidratos de carbono. Los hidratos de carbono pueden aumentar los niveles de glucosa en la sangre, pero deben incluirse en su dieta. Los hidratos de carbono aportan vitaminas, minerales y [place] que son [place] parte esencial de una dieta saludable. Los hidratos de carbono se encuentran en frutas, verduras, cereales integrales, productos lcteos, legumbres y alimentos que contienen azcares aadidos.  Consuma alimentos saludables. Alterne 3 comidas con 3 colaciones.  Aumente de peso saludablemente. El aumento del peso total vara de acuerdo con el ndice de masa corporal que tena antes del embarazo [facility]).  Lleve una tarjeta de alerta mdica o use una pulsera o medalla de alerta mdica.  Lleve con usted una colacin de 15gramos de hidratos de carbono en todo  momento para controlar los niveles bajos de glucosa en la sangre (hipoglucemia). Algunos ejemplos de colaciones de 15gramos de hidratos de carbono son los siguientes:  Tabletas de glucosa, 3 o 4.  Gel de glucosa, tubo de 15 gramos.  Pasas de uva, 2 cucharadas (24 g).  Caramelos de goma, 6.  Galletas de [place], 8.  Jugo de fruta, gaseosa comn, o Superior, 4 onzas (120 ml).  Pastillas de goma, 9.  Reconocer la hipoglucemia. Durante el embarazo la hipoglucemia se produce cuando hay niveles de glucosa en la sangre de 60 mg/dl o menos. El riesgo de hipoglucemia aumenta durante el ayuno o cuando se saltea las comidas, durante o despus de Education officer, environmental ejercicio intenso y [place] duerme. Los sntomas de hipoglucemia son:  Temblores o sacudidas.  Disminucin de la capacidad de concentracin.  Sudoracin.  Aumento de la frecuencia cardaca.  Dolor  de cabeza.  Sequedad en la boca.  Hambre.  Irritabilidad.  Ansiedad.  Sueo agitado.  Alteracin del habla o de la coordinacin.  Confusin.  Tratar la hipoglucemia rpidamente. Si usted est alerta y puede tragar con seguridad, siga la regla de 15/15 que consiste en:  [place] Southernome entre 15 y 20gramos de glucosa de accin rpida o carbohidratos. Las opciones de accin rpida son un gel de glucosa, tabletas de glucosa, o 4 onzas (120 ml) de jugo de frutas, gaseosa comn, o leche baja en grasa.  Compruebe su nivel de glucosa en la sangre 15 minutos despus de tomar la glucosa.  Tome entre 15 y 20 gramos ms de glucosa si el nivel de glucosa en la sangre todava es de 70mg /dl o inferior.  Ingiera una comida o una colacin en el lapso de 1 hora una vez que los niveles de glucosa en la sangre vuelven a la normalidad.  Est atento a la poliuria (miccin excesiva) y la polidipsia (sensacin de mucha sed), que son los primeros signos de la hiperglucemia. El reconocimiento temprano de la hiperglucemia permite un tratamiento oportuno.  Trate la hiperglucemia segn le indic su mdico.  Haga actividad fsica por lo menos 30minutos al da o como lo indique su mdico. Se recomienda que 30 minutos despus de cada comida, realice diez minutos de actividad fsica para controlar los niveles de glucosa postprandial en la [place].  Ajuste su dosis de insulina y la ingesta de alimentos, segn sea necesario, si inicia un nuevo ejercicio o deporte.  Siga su plan para los [address] de enfermedad cuando no pueda comer o beber como de [place]costumbre.  Evite el tabaco y el alcohol.  Concurra a todas las visitas de control como se lo haya indicado el mdico.  Siga el consejo del mdico respecto a los controles prenatales y posteriores al parto (postparto), las visitas, la planificacin de las comidas, el ejercicio, los medicamentos, las vitaminas, los anlisis de [place], otras pruebas mdicas y [place]actividades fsicas.  Realice diariamente el cuidado de la piel y de los pies. Examine su piel y los pies diariamente para ver si tiene cortes, moretones, enrojecimiento, problemas en las uas, sangrado, ampollas o Advertising account plannerllagas.  Cepllese los dientes y encas por lo menos dos veces al da y use hilo dental al menos una vez por da. Concurra regularmente a las visitas de control con el dentista.  Programe un examen de vista durante el primer trimestre de su embarazo o como lo indique su mdico.  Comparta su plan de control de diabetes en el trabajo o en la escuela.  Mantngase al da con las vacunas.  Aprenda a Dealermanejar el estrs.  Obtenga la mayor cantidad posible de informacin sobre la diabetes y solicite ayuda siempre que sea necesario.  Obtenga informacin sobre el amamantamiento y analice esta posibilidad.  Debe controlar el nivel de azcar en la sangre de 6a 12semanas despus del parto. Esto se hace con una prueba de tolerancia a la glucosa oral (PTGO). SOLICITE ATENCIN MDICA SI:   No puede comer alimentos o beber por ms de 6 horas.  Tuvo  nuseas o ha vomitado durante ms de 6 horas.  Tiene un nivel de glucosa en la sangre de 200 mg/dl y cetonas en la orina.  Presenta algn cambio en el estado mental.  Desarrolla problemas de visin.  Sufre un dolor persistente de Training and development officercabeza.  Siente dolor o molestias en la parte superior del abdomen.  Desarrolla una enfermedad grave adicional.  Tuvo diarrea durante ms de  6 horas.  Ha estado enfermo o ha tenido fiebre durante un par [address] y no mejora. SOLICITE ATENCIN MDICA DE INMEDIATO SI:   Tiene dificultad para respirar.  Ya no siente los movimientos del beb.  Est sangrando o tiene flujo vaginal.  Comienza a tener contracciones o trabajo de [place] prematuro. ASEGRESE DE QUE:  Comprende estas instrucciones.  Controlar su afeccin.  Recibir ayuda de inmediato si no mejora o si empeora.   Esta informacin no tiene Theme park manager el consejo del mdico. Asegrese de hacerle al mdico cualquier pregunta que tenga.   Document Released: 02/18/2005 Document Revised: 06/01/2014 Elsevier Interactive Patient Education [employer]

## 2015-12-30 NOTE — Progress Notes (Signed)
[employer] Interpreter # [id number]

## 2015-12-30 NOTE — Progress Notes (Signed)
**Note Alice-Identified via Obfuscation** Subjective:    Alice BroachRosa Romero Velez is a 20 y.o. G1P0000 1852w4d being seen today for her obstetrical visit.  Patient reports no complaints. Fetal movement: normal. No vaginal bleeding or LOF.   Takes NPH insulin 26 units in the AM and 10 units before bedtime. Takes Novolin 10 units with breakfast and 6 units with dinner. Fasting CBGs 82-111. 2hr PP breakfast 126-194. 2hr PP lunch 94-209. 2hr PP dinner 138-175 (one reading of 335).    Glucose readings per log brought in by patient: Fasting:  90, 109, 107, 97, 111, 82, 87 2 hr post breakfast: 178, 185, 65, 194, 126, 140, 126 2 hr post lunch: 94, 170, 202, 137, 209, 190, 144 2 hr post dinner:  182, 166, 138, 335, 172, 175, 160   Objective:    BP 114/72   Pulse 87   Wt 150 lb 4.8 oz (68.2 kg)   LMP 05/23/2015   BMI 30.36 kg/m   Physical Exam  Exam  FHT: Fetal Heart Rate (bpm): 153  Uterine Size:   31 cm  Presentation:   Cephalic     Assessment:    Pregnancy:  G1P0000   GDMA2/B   Plan:    1. Supervision of high risk pregnancy, antepartum, third trimester  2. GDM, class A2/B - NST biweekly starting next week -Growth US 8/15 at 8:30 am -INSULIN CHANGES: -NPH: 26 units -> 30 units qAM  ; 10 units qPM (same) -Novolin:  10 units  qbreakfast (same) ;  0 units ->  4 units qlunch ; 6 units -> 8 units qdinner  Reviewed medication changes with the patient for her insulin requirements.  Follow up in 1-2 weeks for Blood glucose check.     Alice Hollingsheadatherine L Wallace, DO    OB FELLOW CLINIC NOTE ATTESTATION  I have seen and examined this patient and agree with above documentation in the resident's note.   Alice MowElizabeth Mumaw, DO

## 2016-01-06 ENCOUNTER — Ambulatory Visit (INDEPENDENT_AMBULATORY_CARE_PROVIDER_SITE_OTHER): Payer: Self-pay | Admitting: Family Medicine

## 2016-01-06 VITALS — BP 103/64 | HR 75 | Wt 151.0 lb

## 2016-01-06 DIAGNOSIS — O24419 Gestational diabetes mellitus in pregnancy, unspecified control: Secondary | ICD-10-CM

## 2016-01-06 DIAGNOSIS — O0993 Supervision of high risk pregnancy, unspecified, third trimester: Secondary | ICD-10-CM

## 2016-01-06 NOTE — Progress Notes (Signed)
Subjective:  Alice BroachRosa Romero Velez is a 20 y.o. G1P0000 at 4286w4d being seen today for ongoing prenatal care.  She is currently monitored for the following issues for this high-risk pregnancy and has Supervision of high risk pregnancy, antepartum and GDM, class A2/B on her problem list.  Patient reports no complaints.  Contractions: Not present. Vag. Bleeding: None.  Movement: Present. Denies leaking of fluid.   BG log: FBS- 98/94/106/102/98/81/113 Break- 157/152/100/155/116/100/105 Lunch- 122/138/191/139/181/188/181 Dinner- 122/194/170/193/151/163/189  The following portions of the patient's history were reviewed and updated as appropriate: allergies, current medications, past family history, past medical history, past social history, past surgical history and problem list. Problem list updated.  Objective:   Vitals:   01/06/16 1354  BP: 103/64  Pulse: 75  Weight: 151 lb (68.5 kg)    Fetal Status: Fetal Heart Rate (bpm): NST   Movement: Present     General:  Alert, oriented and cooperative. Patient is in no acute distress.  Skin: Skin is warm and dry. No rash noted.   Cardiovascular: Normal heart rate noted  Respiratory: Normal respiratory effort, no problems with respiration noted  Abdomen: Soft, gravid, appropriate for gestational age. Pain/Pressure: Present     Pelvic:  Cervical exam deferred        Extremities: Normal range of motion.  Edema: None  Mental Status: Normal mood and affect. Normal behavior. Normal judgment and thought content.   Urinalysis:       I personally reviewed the patient's NST today, found to be REACTIVE. No contractions  Assessment and Plan:  Pregnancy: G1P0000 at 7286w4d  1. Supervision of high risk pregnancy, antepartum, third trimester  2. GDM, class A2/B CHANGES TODAY TO INSULIN/MEDS: - NPH 30 units qAM  10->12 units qPM  - Novolin 10 units qbreakfast; 4->6 units qlunch; 8->10 units qdinner   Preterm labor symptoms and general obstetric  precautions including but not limited to vaginal bleeding, contractions, leaking of fluid and fetal movement were reviewed in detail with the patient. Please refer to After Visit Summary for other counseling recommendations.  Return in about 1 week (around 01/13/2016) for obfu/nst ( has 2nd nst scheduled for this week).   [facility]. LouisElizabeth Woodland CygnetMumaw, [place]DO

## 2016-01-06 NOTE — Patient Instructions (Addendum)
NPH - 30 units in the morning  10 units in the evening Novolin - 10 units with breakfast  6 units with lunch 10 units with dinner   Diabetes mellitus gestacional (Gestational Diabetes Mellitus) La diabetes mellitus gestacional, ms comnmente conocida como diabetes gestacional es un tipo de diabetes que desarrollan algunas mujeres durante el [place]. En la diabetes gestacional, el pncreas no produce suficiente insulina (una hormona) o las clulas son menos sensibles a la insulina producida (resistencia a la insulina), o ambas cosas. Normalmente, la [employer] azcares de los alimentos a las clulas de los tejidos. Las clulas de los tejidos [employer] azcares para Psychiatrist. La falta de insulina o la falta de una respuesta normal a la insulina hace que el exceso de azcar se acumule en la sangre en lugar de Customer service manager en las clulas de los tejidos. Como resultado, se producen niveles altos de Banker (hiperglucemia). El [address] de los niveles altos de International aid/development worker (glucosa) puede causar muchos problemas.  FACTORES DE RIESGO Usted tiene mayor probabilidad de desarrollar diabetes gestacional si tiene antecedentes familiares de diabetes y tambin si tiene uno o ms de los siguientes factores de riesgo:  ndice de masa corporal superior a 30 (obesidad).  Embarazo previo con diabetes gestacional.  La edad avanzada en el momento del embarazo. Si se mantienen los niveles de glucosa en la sangre en un rango normal durante el [place], las mujeres pueden tener un embarazo saludable. Si los niveles de glucosa en la sangre no estn bien controlados, puede haber riesgos para usted, el feto o el recin nacido, o durante el [place] de parto y [place].  SNTOMAS  Si se presentan sntomas, stos son similares a los sntomas que normalmente experimentar durante el embarazo. Los sntomas de la diabetes gestacional son:   Wyvonnia Dusky de la sed (polidipsia).  Aumento de la miccin  (poliuria).  Orina con ms frecuencia durante la noche (nocturia).  Prdida de peso. La prdida de peso puede ser muy rpida.  Infecciones frecuentes y recurrentes.  Cansancio (fatiga).  Debilidad.  Cambios en la visin, como visin borrosa.  Olor a Water quality scientist.  Dolor abdominal. DIAGNSTICO La diabetes se diagnostica cuando hay aumento de los niveles de glucosa en la [place]. El nivel de glucosa en la sangre puede controlarse en uno o ms de los siguientes anlisis de sangre:  Medicin de glucosa en la sangre en [place]. No se le permitir comer durante al menos 8 horas antes de que se tome [place] de [place].  Pruebas al azar de glucosa en la sangre. El nivel de glucosa en la sangre se controla en cualquier momento del da sin importar el momento en que haya comido.  Prueba de tolerancia a la glucosa oral (PTGO). La glucosa en la sangre se mide despus de no haber comido (ayunas) durante una a tres horas y despus de beber una bebida que contenga glucosa. Dado que las hormonas que causan la resistencia a la insulina son ms altas alrededor [employer] 24 a 28 de Psychiatrist, generalmente se realiza una PTGO durante ese tiempo. Si tiene factores de riesgo, en la primera visita prenatal pueden hacerle pruebas de deteccin de diabetes tipo 2 no diagnosticada. TRATAMIENTO  La diabetes gestacional debe controlarse en primer lugar con dieta y ejercicios. Pueden agregarse medicamentos, pero solo si son necesarios.  Usted tendr que tomar medicamentos para la diabetes o insulina diariamente para Pharmacologist los niveles de glucosa en la sangre en el  rango deseado.  Usted tendr [employer] dosis de insulina con la actividad fsica y la eleccin de alimentos saludables. Si tiene diabetes gestacional, el objetivo del tratamiento ser [employer] siguientes niveles sanguneos de glucosa:  Antes de las comidas (preprandial): valor de 95 mg/dl o inferior.  Despus de las comidas  (posprandial):  Una hora despus de la comida: valor de 140 mg/dl o inferior.  Dos horas despus de la comida: valor de 120 mg/dl o inferior. Si tiene diabetes tipo 1 o tipo 2 preexistente, el objetivo del tratamiento ser [employer] siguientes niveles sanguneos de glucosa:  Antes de las comidas, a la hora de acostarse y durante la noche: de 60 a 99 mg/dl.  Despus de las comidas: valor mximo de 100 a 129 mg/dl. INSTRUCCIONES PARA EL CUIDADO EN EL HOGAR   Controle su nivel de hemoglobina A1c dos veces al ao.  Contrlese a diario el nivel de glucosa en la sangre segn las indicaciones de su mdico. Es comn Education officer, environmental controles frecuentes de la glucosa en la [place].  Supervise las cetonas en la orina cuando est enferma y segn las indicaciones de su mdico.  Tome el medicamento para la diabetes y adminstrese insulina segn las indicaciones de su mdico para Radio producer nivel de glucosa en la sangre en el rango deseado.  Nunca se quede sin medicamento para la diabetes o sin insulina. Es necesario que la reciba [employer].  Ajuste la insulina segn la ingesta de hidratos de carbono. Los hidratos de carbono pueden aumentar los niveles de glucosa en la sangre, pero deben incluirse en su dieta. Los hidratos de carbono aportan vitaminas, minerales y [place] que son [place] parte esencial de una dieta saludable. Los hidratos de carbono se encuentran en frutas, verduras, cereales integrales, productos lcteos, legumbres y alimentos que contienen azcares aadidos.  Consuma alimentos saludables. Alterne 3 comidas con 3 colaciones.  Aumente de peso saludablemente. El aumento del peso total vara de acuerdo con el ndice de masa corporal que tena antes del embarazo [facility]).  Lleve una tarjeta de alerta mdica o use una pulsera o medalla de alerta mdica.  Lleve con usted una colacin de 15gramos de hidratos de carbono en todo momento para controlar los niveles bajos de glucosa en la sangre  (hipoglucemia). Algunos ejemplos de colaciones de 15gramos de hidratos de carbono son los siguientes:  Tabletas de glucosa, 3 o 4.  Gel de glucosa, tubo de 15 gramos.  Pasas de uva, 2 cucharadas (24 g).  Caramelos de goma, 6.  Galletas de [place], 8.  Jugo de fruta, gaseosa comn, o [place], 4 onzas (120 ml).  Pastillas de goma, 9.  Reconocer la hipoglucemia. Durante el embarazo la hipoglucemia se produce cuando hay niveles de glucosa en la sangre de 60 mg/dl o menos. El riesgo de hipoglucemia aumenta durante el ayuno o cuando se saltea las comidas, durante o despus de Education officer, environmental ejercicio intenso y [place] duerme. Los sntomas de hipoglucemia son:  Temblores o sacudidas.  Disminucin de la capacidad de concentracin.  Sudoracin.  Aumento de la frecuencia cardaca.  Dolor de [place].  Sequedad en la boca.  Hambre.  Irritabilidad.  Ansiedad.  Sueo agitado.  Alteracin del habla o de la coordinacin.  Confusin.  Tratar la hipoglucemia rpidamente. Si usted est alerta y puede tragar con seguridad, siga la regla de 15/15 que consiste en:  [employer] 15 y 20gramos de glucosa de accin rpida o carbohidratos. Las opciones de accin rpida son un gel  de glucosa, tabletas de glucosa, o 4 onzas (120 ml) de jugo de frutas, gaseosa comn, o leche baja en grasa.  Compruebe su nivel de glucosa en la sangre 15 minutos despus de tomar la glucosa.  Tome entre 15 y 20 gramos ms de glucosa si el nivel de glucosa en la sangre todava es de 70mg /dl o inferior.  Ingiera una comida o una colacin en el lapso de 1 hora una vez que los niveles de glucosa en la sangre vuelven a la normalidad.  Est atento a la poliuria (miccin excesiva) y la polidipsia (sensacin de mucha sed), que son los primeros signos de la hiperglucemia. El reconocimiento temprano de la hiperglucemia permite un tratamiento oportuno. Trate la hiperglucemia segn le indic su mdico.  Haga actividad  fsica por lo menos 30minutos al da o como lo indique su mdico. Se recomienda que 30 minutos despus de cada comida, realice diez minutos de actividad fsica para controlar los niveles de glucosa postprandial en la [place] Heightssangre.  Ajuste su dosis de insulina y la ingesta de alimentos, segn sea necesario, si inicia un nuevo ejercicio o deporte.  Siga su plan para los [address] de enfermedad cuando no pueda comer o beber como de Tappencostumbre.  Evite el tabaco y el alcohol.  Concurra a todas las visitas de control como se lo haya indicado el mdico.  Siga el consejo del mdico respecto a los controles prenatales y posteriores al parto (postparto), las visitas, la planificacin de las comidas, el ejercicio, los medicamentos, las vitaminas, los anlisis de [place]re, otras pruebas mdicas y Chiltonactividades fsicas.  Realice diariamente el cuidado de la piel y de los pies. Examine su piel y los pies diariamente para ver si tiene cortes, moretones, enrojecimiento, problemas en las uas, sangrado, ampollas o Advertising account plannerllagas.  Cepllese los dientes y encas por lo menos dos veces al da y use hilo dental al menos una vez por da. Concurra regularmente a las visitas de control con el dentista.  Programe un examen de vista durante el primer trimestre de su embarazo o como lo indique su mdico.  Comparta su plan de control de diabetes en el trabajo o en la escuela.  Mantngase al da con las vacunas.  Aprenda a Dealermanejar el estrs.  Obtenga la mayor cantidad posible de informacin sobre la diabetes y solicite ayuda siempre que sea necesario.  Obtenga informacin sobre el amamantamiento y analice esta posibilidad.  Debe controlar el nivel de azcar en la sangre de 6a 12semanas despus del parto. Esto se hace con una prueba de tolerancia a la glucosa oral (PTGO). SOLICITE ATENCIN MDICA SI:   No puede comer alimentos o beber por ms de 6 horas.  Tuvo nuseas o ha vomitado durante ms de 6 horas.  Tiene un nivel de  glucosa en la sangre de 200 mg/dl y cetonas en la orina.  Presenta algn cambio en el estado mental.  Desarrolla problemas de visin.  Sufre un dolor persistente de Training and development officercabeza.  Siente dolor o molestias en la parte superior del abdomen.  Desarrolla una enfermedad grave adicional.  Tuvo diarrea durante ms de 6 horas.  Ha estado enfermo o ha tenido fiebre durante un par [address] y no mejora. SOLICITE ATENCIN MDICA DE INMEDIATO SI:   Tiene dificultad para respirar.  Ya no siente los movimientos del beb.  Est sangrando o tiene flujo vaginal.  Comienza a tener contracciones o trabajo de Avardparto prematuro. ASEGRESE DE QUE:  Comprende estas instrucciones.  Controlar su afeccin.  Recibir Jacobs Engineeringayuda  de inmediato si no mejora o si empeora.   Esta informacin no tiene Theme park managercomo fin reemplazar el consejo del mdico. Asegrese de hacerle al mdico cualquier pregunta que tenga.   Document Released: 02/18/2005 Document Revised: 06/01/2014 Elsevier Interactive Patient Education 2016 [employer]vier Inc[employer]   Systems analystTercer trimestre de Psychiatristembarazo (Third Trimester of Pregnancy) El tercer trimestre comprende desde la semana29 The ServiceMaster Companyhasta la semana42, es decir, desde el mes7 hasta el [address]. En este trimestre, el feto crece muy rpido. Hacia el final del noveno mes, el feto mide alrededor de 20pulgadas (45cm) de largo y pesa entre 6y 10libras [phone](2,700y 4,500kg).  CUIDADOS EN EL HOGAR   No fume, no consuma hierbas ni beba alcohol. No tome frmacos que el mdico no haya autorizado.  No consuma ningn producto que contenga tabaco, lo que incluye cigarrillos, tabaco de Theatre managermascar o Administrator, Civil Servicecigarrillos electrnicos. Si necesita ayuda para dejar de fumar, consulte al American Expressmdico. Puede recibir asesoramiento u otro tipo de apoyo para dejar de fumar.  Tome los medicamentos solamente como se lo haya indicado el mdico. Algunos medicamentos son seguros para tomar durante el Psychiatristembarazo y otros no lo son.  Haga ejercicios solamente como se lo  haya indicado el mdico. Interrumpa la actividad fsica si comienza a tener calambres.  Ingiera alimentos saludables de Uticamanera regular.  Use un sostn que le brinde buen soporte si sus mamas estn sensibles.  No se d baos de inmersin en agua caliente, baos turcos ni saunas.  Colquese el cinturn de seguridad cuando conduzca.  No coma carne cruda ni queso sin cocinar; evite el contacto con las bandejas sanitarias de los gatos y la tierra que estos animales usan.  Tome las vitaminas prenatales.  Tome entre 1500 y 2000mg  de calcio diariamente comenzando en la semana20 del embarazo [place]asta el parto.  Pruebe tomar un medicamento que la ayude a defecar (un laxante suave) si el mdico lo autoriza. Consuma ms fibra, que se encuentra en las frutas y verduras frescas y los cereales integrales. Beba suficiente lquido para mantener el pis (orina) claro o de color amarillo plido.  Dese baos de asiento con agua tibia para Engineer, materialsaliviar el dolor o las molestias causadas por las hemorroides. Use una crema para las hemorroides si el mdico la autoriza.  Si se le hinchan las venas (venas varicosas), use medias de descanso. Levante (eleve) los pies durante 15minutos, 3 o 4veces por Futures traderda. Limite el consumo de sal en su dieta.  No levante objetos pesados, use zapatos de tacones bajos y sintese derecha.  Descanse con las piernas elevadas si tiene calambres o dolor de cintura.  Visite a su dentista si no lo ha Occupational hygienisthecho durante el embarazo. Use un cepillo de cerdas suaves para cepillarse los dientes. Psese el hilo dental con suavidad.  Puede seguir Calpine Corporationmanteniendo relaciones sexuales, a menos que el mdico le indique lo contrario.  No haga viajes de larga distancia, excepto si es obligatorio y solamente con la aprobacin del mdico.  Tome clases prenatales.  Practique ir manejando al hospital.  Prepare el bolso que llevar al hospital.  Prepare la habitacin del beb.  Concurra a los controles  mdicos. SOLICITE AYUDA SI:  No est segura de si est en trabajo de parto o si ha roto la bolsa de las aguas.  Tiene mareos.  Siente calambres leves o presin en la parte inferior del abdomen.  Sufre un dolor persistente en el abdomen.  Tiene Programme researcher, broadcasting/film/videomalestar estomacal (nuseas), vmitos, o tiene deposiciones acuosas (diarrea).  Advierte un olor ftido que proviene de  la vagina.  Siente dolor al [employer]. SOLICITE AYUDA DE INMEDIATO SI:   Tiene fiebre.  Tiene una prdida de lquido por la vagina.  Tiene sangrado o pequeas prdidas vaginales.  Siente dolor intenso o clicos en el abdomen.  Sube o baja de peso rpidamente.  Tiene dificultades para recuperar el aliento y siente dolor en el pecho.  Sbitamente se le hinchan mucho el rostro, las [place], los tobillos, los pies o las piernas.  No ha sentido los movimientos del beb durante Georgianne Fick.  Siente un dolor de cabeza intenso que no se alivia con medicamentos.  Su visin se modifica.   Esta informacin no tiene Theme park manager el consejo del mdico. Asegrese de hacerle al mdico cualquier pregunta que tenga.   Document Released: 01/11/2013 Document Revised: 06/01/2014 Elsevier Interactive Patient Education [employer]

## 2016-01-06 NOTE — Progress Notes (Signed)
Used Video interpreter Doy MinceKryssia [phone].

## 2016-01-07 ENCOUNTER — Other Ambulatory Visit (HOSPITAL_COMMUNITY): Payer: Self-pay | Admitting: Maternal and Fetal Medicine

## 2016-01-07 ENCOUNTER — Ambulatory Visit (HOSPITAL_COMMUNITY)
Admission: RE | Admit: 2016-01-07 | Discharge: 2016-01-07 | Disposition: A | Discharge status: Home / Self Care | Payer: Self-pay | Source: Ambulatory Visit | Attending: Obstetrics and Gynecology | Admitting: Obstetrics and Gynecology

## 2016-01-07 ENCOUNTER — Encounter (HOSPITAL_COMMUNITY): Payer: Self-pay

## 2016-01-07 DIAGNOSIS — Z3A32 32 weeks gestation of pregnancy: Secondary | ICD-10-CM

## 2016-01-07 DIAGNOSIS — O24313 Unspecified pre-existing diabetes mellitus in pregnancy, third trimester: Secondary | ICD-10-CM | POA: Insufficient documentation

## 2016-01-07 DIAGNOSIS — O24913 Unspecified diabetes mellitus in pregnancy, third trimester: Secondary | ICD-10-CM

## 2016-01-09 ENCOUNTER — Ambulatory Visit (INDEPENDENT_AMBULATORY_CARE_PROVIDER_SITE_OTHER): Payer: Self-pay | Admitting: General Practice

## 2016-01-09 VITALS — BP 106/60 | HR 80

## 2016-01-09 DIAGNOSIS — O24414 Gestational diabetes mellitus in pregnancy, insulin controlled: Secondary | ICD-10-CM

## 2016-01-09 NOTE — Progress Notes (Signed)
Mariel used for interpreter 

## 2016-01-13 ENCOUNTER — Ambulatory Visit (INDEPENDENT_AMBULATORY_CARE_PROVIDER_SITE_OTHER): Payer: Self-pay | Admitting: Obstetrics and Gynecology

## 2016-01-13 VITALS — BP 105/65 | HR 83 | Wt 152.3 lb

## 2016-01-13 DIAGNOSIS — O0993 Supervision of high risk pregnancy, unspecified, third trimester: Secondary | ICD-10-CM

## 2016-01-13 DIAGNOSIS — O24414 Gestational diabetes mellitus in pregnancy, insulin controlled: Secondary | ICD-10-CM

## 2016-01-13 LAB — POCT URINALYSIS DIP (DEVICE)
BILIRUBIN URINE: NEGATIVE
GLUCOSE, UA: NEGATIVE mg/dL
KETONES UR: NEGATIVE mg/dL
Nitrite: NEGATIVE
PROTEIN: NEGATIVE mg/dL
SPECIFIC GRAVITY, URINE: 1.015 (ref 1.005–1.030)
Urobilinogen, UA: 0.2 mg/dL (ref 0.0–1.0)
pH: 7 (ref 5.0–8.0)

## 2016-01-13 MED ORDER — INSULIN NPH (HUMAN) (ISOPHANE) 100 UNIT/ML ~~LOC~~ SUSP: SUBCUTANEOUS | 3 refills | Status: DC

## 2016-01-13 MED ORDER — INSULIN REGULAR HUMAN 100 UNIT/ML IJ SOLN: INTRAMUSCULAR | 11 refills | Status: DC

## 2016-01-13 NOTE — Progress Notes (Signed)
Subjective:  Alice Velez is a 20 y.o. G1P0000 at 5343w4d being seen today for ongoing prenatal care.  She is currently monitored for the following issues for this high-risk pregnancy and has Supervision of high risk pregnancy, antepartum and GDM, class A2/B on her problem list.  Patient reports no complaints.  Contractions: Not present. Vag. Bleeding: None.  Movement: Present. Denies leaking of fluid.   She does report that she has not been taking her insulin the way it was prescribed as she misunderstood the doctor at the last visit. She was taking 30 units NPH in AM, 10 untis novolin with breakfast, 6 units with lunch, 10 units with dinner and then 10 more units of novolin at bed time. Her sugars are running between 95 and 105 with breakfast and between 120-180 2 hours post prandial.   The following portions of the patient's history were reviewed and updated as appropriate: allergies, current medications, past family history, past medical history, past social history, past surgical history and problem list. Problem list updated.  Objective:   Vitals:   01/13/16 0948  BP: 105/65  Pulse: 83  Weight: 152 lb 4.8 oz (69.1 kg)    Fetal Status: Fetal Heart Rate (bpm): NST   Movement: Present  Presentation: Vertex  NST reactive General:  Alert, oriented and cooperative. Patient is in no acute distress.  Skin: Skin is warm and dry. No rash noted.   Cardiovascular: Normal heart rate noted  Respiratory: Normal respiratory effort, no problems with respiration noted  Abdomen: Soft, gravid, appropriate for gestational age. Pain/Pressure: Present     Pelvic:  Cervical exam deferred        Extremities: Normal range of motion.  Edema: None  Mental Status: Normal mood and affect. Normal behavior. Normal judgment and thought content.   Urinalysis: Urine Protein: Negative Urine Glucose: Negative  Assessment and Plan:  Pregnancy: G1P0000 at 3743w4d  1. Insulin controlled gestational diabetes  mellitus (GDM) in third trimester - Amniotic fluid index with NST -NST reactive -increase insulin to NPH 30 and 10, novolin 12, 8, 12.  2. Supervision of high risk pregnancy, antepartum, third trimester routine care  Term labor symptoms and general obstetric precautions including but not limited to vaginal bleeding, contractions, leaking of fluid and fetal movement were reviewed in detail with the patient. Please refer to After Visit Summary for other counseling recommendations.   Follow up in 1 wk.   Lorne SkeensNicholas Michael Niyanna Asch, MD

## 2016-01-13 NOTE — Progress Notes (Signed)
Pt has not been taking insulin correctly - is taking NPH only in am and taking Humalog 10 units @ bedtime. Pt states this is what the doctor had written down.

## 2016-01-13 NOTE — Patient Instructions (Signed)
Antes desayuno 30 units NPH Con desayuno 12 units novolin Con alumerzo 10 units novolin Con cena 12 units novolin Antes de dormir 10 untis NPH

## 2016-01-15 NOTE — Progress Notes (Signed)
8/17 NST reviewed and reactive

## 2016-01-17 ENCOUNTER — Ambulatory Visit (INDEPENDENT_AMBULATORY_CARE_PROVIDER_SITE_OTHER): Payer: Self-pay | Admitting: Obstetrics and Gynecology

## 2016-01-17 VITALS — BP 111/61

## 2016-01-17 DIAGNOSIS — O24419 Gestational diabetes mellitus in pregnancy, unspecified control: Secondary | ICD-10-CM

## 2016-01-17 NOTE — Progress Notes (Signed)
NST reviewed and reactive.  

## 2016-01-17 NOTE — Progress Notes (Signed)
Used [employer] Interpreter [phone].

## 2016-01-22 ENCOUNTER — Ambulatory Visit (INDEPENDENT_AMBULATORY_CARE_PROVIDER_SITE_OTHER): Payer: Self-pay | Admitting: Obstetrics & Gynecology

## 2016-01-22 VITALS — BP 117/66 | HR 90 | Wt 155.7 lb

## 2016-01-22 DIAGNOSIS — O24414 Gestational diabetes mellitus in pregnancy, insulin controlled: Secondary | ICD-10-CM

## 2016-01-22 DIAGNOSIS — O0993 Supervision of high risk pregnancy, unspecified, third trimester: Secondary | ICD-10-CM

## 2016-01-22 DIAGNOSIS — Z36 Encounter for antenatal screening of mother: Secondary | ICD-10-CM

## 2016-01-22 MED ORDER — INSULIN REGULAR HUMAN 100 UNIT/ML IJ SOLN: INTRAMUSCULAR | 11 refills | Status: DC

## 2016-01-22 MED ORDER — INSULIN NPH (HUMAN) (ISOPHANE) 100 UNIT/ML ~~LOC~~ SUSP: SUBCUTANEOUS | 3 refills | Status: DC

## 2016-01-22 NOTE — Progress Notes (Signed)
   PRENATAL VISIT NOTE  Subjective:  Alice BroachRosa Romero Velez is a 20 y.o. G1P0000 at 3127w6d being seen today for ongoing prenatal care.  She is currently monitored for the following issues for this high-risk pregnancy and has Supervision of high risk pregnancy, antepartum and GDM, class A2/B on her problem list.  Patient reports no complaints.  Contractions: Irregular. Vag. Bleeding: None.  Movement: Present. Denies leaking of fluid.   The following portions of the patient's history were reviewed and updated as appropriate: allergies, current medications, past family history, past medical history, past social history, past surgical history and problem list. Problem list updated.  Objective:   Vitals:   01/22/16 1444  BP: 117/66  Pulse: 90  Weight: 155 lb 11.2 oz (70.6 kg)    Fetal Status: Fetal Heart Rate (bpm): NST   Movement: Present  Presentation: Vertex  General:  Alert, oriented and cooperative. Patient is in no acute distress.  Skin: Skin is warm and dry. No rash noted.   Cardiovascular: Normal heart rate noted  Respiratory: Normal respiratory effort, no problems with respiration noted  Abdomen: Soft, gravid, appropriate for gestational age. Pain/Pressure: Present     Pelvic:  Cervical exam deferred        Extremities: Normal range of motion.  Edema: Trace  Mental Status: Normal mood and affect. Normal behavior. Normal judgment and thought content.   Urinalysis:      Assessment and Plan:  Pregnancy: G1P0000 at 6827w6d  1. Insulin controlled gestational diabetes mellitus (GDM) in third trimester FBS 85-97, PP 130-250 - Amniotic fluid index with NST Poor control 2. Supervision of high risk pregnancy, antepartum, third trimester Reactive NST and normal AFI Increased R to 14 units with meals and N to 12 units HS Preterm labor symptoms and general obstetric precautions including but not limited to vaginal bleeding, contractions, leaking of fluid and fetal movement were reviewed in  detail with the patient. Please refer to After Visit Summary for other counseling recommendations.  Return in about 2 days (around 01/24/2016) for NST.  Adam PhenixJames G Finas Delone, MD

## 2016-01-22 NOTE — Patient Instructions (Signed)
Tercer trimestre de embarazo (Third Trimester of Pregnancy) El tercer trimestre comprende desde la semana29 hasta la semana42, es decir, desde el mes7 hasta el mes9. El tercer trimestre es un perodo en el que el feto crece rpidamente. Hacia el final del noveno mes, el feto mide alrededor de 20pulgadas (45cm) de largo y pesa entre 6 y 10 libras (2,700 y 4,500kg).  CAMBIOS EN EL ORGANISMO Su organismo atraviesa por muchos cambios durante el embarazo, y estos varan de una mujer a otra.   Seguir aumentando de peso. Es de esperar que aumente entre 25 y 35libras (11 y 16kg) hacia el final del embarazo.  Podrn aparecer las primeras estras en las caderas, el abdomen y las mamas.  Puede tener necesidad de orinar con ms frecuencia porque el feto baja hacia la pelvis y ejerce presin sobre la vejiga.  Debido al embarazo podr sentir acidez estomacal con frecuencia.  Puede estar estreida, ya que ciertas hormonas enlentecen los movimientos de los msculos que empujan los desechos a travs de los intestinos.  Pueden aparecer hemorroides o abultarse e hincharse las venas (venas varicosas).  Puede sentir dolor plvico debido al aumento de peso y a que las hormonas del embarazo relajan las articulaciones entre los huesos de la pelvis. El dolor de espalda puede ser consecuencia de la sobrecarga de los msculos que soportan la postura.  Tal vez haya cambios en el cabello que pueden incluir su engrosamiento, crecimiento rpido y cambios en la textura. Adems, a algunas mujeres se les cae el cabello durante o despus del embarazo, o tienen el cabello seco o fino. Lo ms probable es que el cabello se le normalice despus del nacimiento del beb.  Las mamas seguirn creciendo y le dolern. A veces, puede haber una secrecin amarilla de las mamas llamada calostro.  El ombligo puede salir hacia afuera.  Puede sentir que le falta el aire debido a que se expande el tero.  Puede notar que el feto  "baja" o lo siente ms bajo, en el abdomen.  Puede tener una prdida de secrecin mucosa con sangre. Esto suele ocurrir en el trmino de unos pocos das a una semana antes de que comience el trabajo de parto.  El cuello del tero se vuelve delgado y blando (se borra) cerca de la fecha de parto. QU DEBE ESPERAR EN LOS EXMENES PRENATALES  Le harn exmenes prenatales cada 2semanas hasta la semana36. A partir de ese momento le harn exmenes semanales. Durante una visita prenatal de rutina:  La pesarn para asegurarse de que usted y el feto estn creciendo normalmente.  Le tomarn la presin arterial.  Le medirn el abdomen para controlar el desarrollo del beb.  Se escucharn los latidos cardacos fetales.  Se evaluarn los resultados de los estudios solicitados en visitas anteriores.  Le revisarn el cuello del tero cuando est prxima la fecha de parto para controlar si este se ha borrado. Alrededor de la semana36, el mdico le revisar el cuello del tero. Al mismo tiempo, realizar un anlisis de las secreciones del tejido vaginal. Este examen es para determinar si hay un tipo de bacteria, estreptococo Grupo B. El mdico le explicar esto con ms detalle. El mdico puede preguntarle lo siguiente:  Cmo le gustara que fuera el parto.  Cmo se siente.  Si siente los movimientos del beb.  Si ha tenido sntomas anormales, como prdida de lquido, sangrado, dolores de cabeza intensos o clicos abdominales.  Si est consumiendo algn producto que contenga tabaco, como cigarrillos, tabaco   de mascar y cigarrillos electrnicos.  Si tiene alguna pregunta. Otros exmenes o estudios de deteccin que pueden realizarse durante el tercer trimestre incluyen lo siguiente:  Anlisis de sangre para controlar los niveles de hierro (anemia).  Controles fetales para determinar su salud, nivel de actividad y crecimiento. Si tiene alguna enfermedad o hay problemas durante el embarazo, le harn  estudios.  Prueba del VIH (virus de inmunodeficiencia humana). Si corre un riesgo alto, pueden realizarle una prueba de deteccin del VIH durante el tercer trimestre del embarazo. FALSO TRABAJO DE PARTO Es posible que sienta contracciones leves e irregulares que finalmente desaparecen. Se llaman contracciones de Braxton Hicks o falso trabajo de parto. Las contracciones pueden durar horas, das o incluso semanas, antes de que el verdadero trabajo de parto se inicie. Si las contracciones ocurren a intervalos regulares, se intensifican o se hacen dolorosas, lo mejor es que la revise el mdico.  SIGNOS DE TRABAJO DE PARTO   Clicos de tipo menstrual.  Contracciones cada 5minutos o menos.  Contracciones que comienzan en la parte superior del tero y se extienden hacia abajo, a la zona inferior del abdomen y la espalda.  Sensacin de mayor presin en la pelvis o dolor de espalda.  Una secrecin de mucosidad acuosa o con sangre que sale de la vagina. Si tiene alguno de estos signos antes de la semana37 del embarazo, llame a su mdico de inmediato. Debe concurrir al hospital para que la controlen inmediatamente. INSTRUCCIONES PARA EL CUIDADO EN EL HOGAR   Evite fumar, consumir hierbas, beber alcohol y tomar frmacos que no le hayan recetado. Estas sustancias qumicas afectan la formacin y el desarrollo del beb.  No consuma ningn producto que contenga tabaco, lo que incluye cigarrillos, tabaco de mascar y cigarrillos electrnicos. Si necesita ayuda para dejar de fumar, consulte al mdico. Puede recibir asesoramiento y otro tipo de recursos para dejar de fumar.  Siga las indicaciones del mdico en relacin con el uso de medicamentos. Durante el embarazo, hay medicamentos que son seguros de tomar y otros que no.  Haga ejercicio solamente como se lo haya indicado el mdico. Sentir clicos uterinos es un buen signo para detener la actividad fsica.  Contine comiendo alimentos sanos con  regularidad.  Use un sostn que le brinde buen soporte si le duelen las mamas.  No se d baos de inmersin en agua caliente, baos turcos ni saunas.  Use el cinturn de seguridad en todo momento mientras conduce.  No coma carne cruda ni queso sin cocinar; evite el contacto con las bandejas sanitarias de los gatos y la tierra que estos animales usan. Estos elementos contienen grmenes que pueden causar defectos congnitos en el beb.  Tome las vitaminas prenatales.  Tome entre 1500 y 2000mg de calcio diariamente comenzando en la semana20 del embarazo hasta el parto.  Si est estreida, pruebe un laxante suave (si el mdico lo autoriza). Consuma ms alimentos ricos en fibra, como vegetales y frutas frescos y cereales integrales. Beba gran cantidad de lquido para mantener la orina de tono claro o color amarillo plido.  Dese baos de asiento con agua tibia para aliviar el dolor o las molestias causadas por las hemorroides. Use una crema para las hemorroides si el mdico la autoriza.  Si tiene venas varicosas, use medias de descanso. Eleve los pies durante 15minutos, 3 o 4veces por da. Limite el consumo de sal en su dieta.  Evite levantar objetos pesados, use zapatos de tacones bajos y mantenga una buena postura.  Descanse   con las piernas elevadas si tiene calambres o dolor de cintura.  Visite a su dentista si no lo ha hecho durante el embarazo. Use un cepillo de dientes blando para higienizarse los dientes y psese el hilo dental con suavidad.  Puede seguir manteniendo relaciones sexuales, a menos que el mdico le indique lo contrario.  No haga viajes largos excepto que sea absolutamente necesario y solo con la autorizacin del mdico.  Tome clases prenatales para entender, practicar y hacer preguntas sobre el trabajo de parto y el parto.  Haga un ensayo de la partida al hospital.  Prepare el bolso que llevar al hospital.  Prepare la habitacin del beb.  Concurra a todas  las visitas prenatales segn las indicaciones de su mdico. SOLICITE ATENCIN MDICA SI:  No est segura de que est en trabajo de parto o de que ha roto la bolsa de las aguas.  Tiene mareos.  Siente clicos leves, presin en la pelvis o dolor persistente en el abdomen.  Tiene nuseas, vmitos o diarrea persistentes.  Observa una secrecin vaginal con mal olor.  Siente dolor al orinar. SOLICITE ATENCIN MDICA DE INMEDIATO SI:   Tiene fiebre.  Tiene una prdida de lquido por la vagina.  Tiene sangrado o pequeas prdidas vaginales.  Siente dolor intenso o clicos en el abdomen.  Sube o baja de peso rpidamente.  Tiene dificultad para respirar y siente dolor de pecho.  Sbitamente se le hinchan mucho el rostro, las manos, los tobillos, los pies o las piernas.  No ha sentido los movimientos del beb durante una hora.  Siente un dolor de cabeza intenso que no se alivia con medicamentos.  Su visin se modifica.   Esta informacin no tiene como fin reemplazar el consejo del mdico. Asegrese de hacerle al mdico cualquier pregunta que tenga.   Document Released: 02/18/2005 Document Revised: 06/01/2014 Elsevier Interactive Patient Education 2016 [employer]vier Inc.  

## 2016-01-22 NOTE — Progress Notes (Signed)
Video interpreter Donald PoreJorge [phone]#[id number] used for encounter

## 2016-01-24 ENCOUNTER — Ambulatory Visit (INDEPENDENT_AMBULATORY_CARE_PROVIDER_SITE_OTHER): Payer: Self-pay | Admitting: Obstetrics & Gynecology

## 2016-01-24 VITALS — BP 106/61 | HR 79

## 2016-01-24 DIAGNOSIS — O24414 Gestational diabetes mellitus in pregnancy, insulin controlled: Secondary | ICD-10-CM

## 2016-01-24 NOTE — Progress Notes (Signed)
NST performed today was reviewed and was found to be reactive.  Continue recommended antenatal testing and prenatal care.  

## 2016-01-28 ENCOUNTER — Ambulatory Visit (INDEPENDENT_AMBULATORY_CARE_PROVIDER_SITE_OTHER): Payer: Self-pay | Admitting: Certified Nurse Midwife

## 2016-01-28 VITALS — BP 117/78 | HR 80

## 2016-01-28 DIAGNOSIS — Z36 Encounter for antenatal screening of mother: Secondary | ICD-10-CM

## 2016-01-28 DIAGNOSIS — O24414 Gestational diabetes mellitus in pregnancy, insulin controlled: Secondary | ICD-10-CM

## 2016-01-28 NOTE — Progress Notes (Signed)
[name] present for encounter.  Pt advised to increase po water intake due to slightly low AFI.

## 2016-01-28 NOTE — Progress Notes (Signed)
NST performed today was reviewed and was found to be reactive.  AFI was also normal (9.1cm) but on lower range. Advised to increase water intake. Continue recommended antenatal testing and prenatal care.

## 2016-01-31 ENCOUNTER — Ambulatory Visit (INDEPENDENT_AMBULATORY_CARE_PROVIDER_SITE_OTHER): Payer: Self-pay | Admitting: Family Medicine

## 2016-01-31 VITALS — BP 112/66 | HR 78

## 2016-01-31 DIAGNOSIS — O24414 Gestational diabetes mellitus in pregnancy, insulin controlled: Secondary | ICD-10-CM

## 2016-01-31 NOTE — Progress Notes (Signed)
NST reviewed - reactive 

## 2016-02-03 ENCOUNTER — Ambulatory Visit (HOSPITAL_COMMUNITY)
Admission: RE | Admit: 2016-02-03 | Discharge: 2016-02-03 | Disposition: A | Discharge status: Home / Self Care | Payer: Self-pay | Source: Ambulatory Visit | Attending: Maternal and Fetal Medicine | Admitting: Maternal and Fetal Medicine

## 2016-02-03 ENCOUNTER — Encounter: Payer: Self-pay | Attending: Obstetrics and Gynecology | Admitting: Dietician

## 2016-02-03 ENCOUNTER — Encounter (HOSPITAL_COMMUNITY): Payer: Self-pay

## 2016-02-03 ENCOUNTER — Ambulatory Visit (INDEPENDENT_AMBULATORY_CARE_PROVIDER_SITE_OTHER): Payer: Self-pay | Admitting: Obstetrics & Gynecology

## 2016-02-03 VITALS — BP 102/64 | HR 99 | Wt 161.6 lb

## 2016-02-03 DIAGNOSIS — O24913 Unspecified diabetes mellitus in pregnancy, third trimester: Secondary | ICD-10-CM

## 2016-02-03 DIAGNOSIS — Z3A00 Weeks of gestation of pregnancy not specified: Secondary | ICD-10-CM | POA: Insufficient documentation

## 2016-02-03 DIAGNOSIS — O24313 Unspecified pre-existing diabetes mellitus in pregnancy, third trimester: Secondary | ICD-10-CM | POA: Insufficient documentation

## 2016-02-03 DIAGNOSIS — O24419 Gestational diabetes mellitus in pregnancy, unspecified control: Secondary | ICD-10-CM | POA: Insufficient documentation

## 2016-02-03 DIAGNOSIS — Z113 Encounter for screening for infections with a predominantly sexual mode of transmission: Secondary | ICD-10-CM

## 2016-02-03 DIAGNOSIS — Z3A36 36 weeks gestation of pregnancy: Secondary | ICD-10-CM | POA: Insufficient documentation

## 2016-02-03 DIAGNOSIS — O0993 Supervision of high risk pregnancy, unspecified, third trimester: Secondary | ICD-10-CM

## 2016-02-03 LAB — POCT URINALYSIS DIP (DEVICE)
Bilirubin Urine: NEGATIVE
GLUCOSE, UA: NEGATIVE mg/dL
Ketones, ur: NEGATIVE mg/dL
Nitrite: NEGATIVE
Protein, ur: 100 mg/dL — AB
SPECIFIC GRAVITY, URINE: 1.02 (ref 1.005–1.030)
UROBILINOGEN UA: 0.2 mg/dL (ref 0.0–1.0)
pH: 7 (ref 5.0–8.0)

## 2016-02-03 LAB — GLUCOSE, CAPILLARY: GLUCOSE-CAPILLARY: 129 mg/dL — AB (ref 65–99)

## 2016-02-03 LAB — OB RESULTS CONSOLE GBS: STREP GROUP B AG: NEGATIVE

## 2016-02-03 NOTE — Progress Notes (Signed)
Diabetes Education: 02/03/16. Seen for consult regarding insulin and issues with low morning Fasting levels and high post meal levels. Currently 7670w4d. Medication: NPH insulin: 30 units in AM and 12 units at bedtime. Regular insulin: Breakfast = 12 units and lunch and dinner = 14 units. Glucose Fasting:73,74,75,80,101. 2 hr post breakfast: 158,148.195,163. 2 hr post lunch: 184, 175, 124, 145, 2 hr post dinner: [phone],269,149,163. Taking 2 separate injections in the AM.  Taking the Regular insulin 20 minutes before eating.  Unsure of all that is going on with her. She is checking her glucose level at night prior to her bedtime snack. Request that she come to clinic next Monday, 02/10/16 and bring her meter, her food that she normally eats. We will monitor her fasting level, her insulin injection, her meal and her post-meal to see if there is and issue with technique.   She Briya Lookabaugh well need to have her regular insulin increase in her regular insulin  to 19 units before lunch and dinner and a decrease in the HS NPH to 10 units.  Will determine on Monday. Maggie Emmajane Altamura, RN, RD, LDN

## 2016-02-03 NOTE — Progress Notes (Signed)
Used Engineer, maintenance (IT)nterpreter Dorita

## 2016-02-03 NOTE — Progress Notes (Signed)
Moderate hemoglobin noted on urinalysis.  

## 2016-02-04 ENCOUNTER — Ambulatory Visit (HOSPITAL_COMMUNITY): Payer: Self-pay

## 2016-02-04 NOTE — Progress Notes (Signed)
   PRENATAL VISIT NOTE  Subjective:  Alice Velez is a 20 y.o. G1P0000 at [redacted]w[redacted]d being seen today for ongoing prenatal care.  She is currently monitored for the following issues for this high-risk pregnancy and has Supervision of high risk pregnancy, antepartum and GDM, class A2/B on her problem list.  Patient reports no complaints.  Contractions: Irregular. Vag. Bleeding: None.  Movement: Present. Denies leaking of fluid.   The following portions of the patient's history were reviewed and updated as appropriate: allergies, current medications, past family history, past medical history, past social history, past surgical history and problem list. Problem list updated.  Objective:   Vitals:   02/03/16 0906  BP: 102/64  Pulse: 99  Weight: 161 lb 9.6 oz (73.3 kg)    Fetal Status: Fetal Heart Rate (bpm): NST   Movement: Present     General:  Alert, oriented and cooperative. Patient is in no acute distress.  Skin: Skin is warm and dry. No rash noted.   Cardiovascular: Normal heart rate noted  Respiratory: Normal respiratory effort, no problems with respiration noted  Abdomen: Soft, gravid, appropriate for gestational age. Pain/Pressure: Present     Pelvic:  Cervical exam performed        Extremities: Normal range of motion.  Edema: Trace  Mental Status: Normal mood and affect. Normal behavior. Normal judgment and thought content.   Urinalysis: Urine Protein: 2+ Urine Glucose: Negative  Assessment and Plan:  Pregnancy: G1P0000 at [redacted]w[redacted]d  1. GDM, class A2/B - see Diabetes educator note--Unsure whether pt is taking her insulin.  She was 129 2 hours after taking her NPH and Regular AND not eating.  See DE note for plan - Fetal nonstress test - GC[facility]Chlamydia probe amp [facility] - Culture, beta strep (group b only) - HgB A1c - Referral to Nutrition and Diabetes Services  2. Supervision of high risk pregnancy, antepartum, third trimester - growth US at 38.5 weeks -  Continue twice weekly testing - Pt aware that high hyperglycemia can lead to fetal demise.  Term labor symptoms and general obstetric precautions including but not limited to vaginal bleeding, contractions, leaking of fluid and fetal movement were reviewed in detail with the patient. Please refer to After Visit Summary for other counseling recommendations.  Return for Needs NSt later this week, and ob/fu/nst/afi Mon/ or Tues next week.  Lesly DukesKelly H Markie Heffernan, MD

## 2016-02-06 ENCOUNTER — Ambulatory Visit (INDEPENDENT_AMBULATORY_CARE_PROVIDER_SITE_OTHER): Payer: Self-pay | Admitting: Obstetrics and Gynecology

## 2016-02-06 VITALS — BP 118/71 | HR 68

## 2016-02-06 DIAGNOSIS — O24414 Gestational diabetes mellitus in pregnancy, insulin controlled: Secondary | ICD-10-CM

## 2016-02-06 LAB — CULTURE, BETA STREP (GROUP B ONLY)

## 2016-02-06 NOTE — Progress Notes (Signed)
NST reviewed and reactive.  

## 2016-02-10 ENCOUNTER — Encounter: Payer: Self-pay | Admitting: Dietician

## 2016-02-10 ENCOUNTER — Ambulatory Visit (INDEPENDENT_AMBULATORY_CARE_PROVIDER_SITE_OTHER): Payer: Self-pay | Admitting: Obstetrics and Gynecology

## 2016-02-10 VITALS — BP 112/73 | HR 89 | Wt 160.4 lb

## 2016-02-10 DIAGNOSIS — O24414 Gestational diabetes mellitus in pregnancy, insulin controlled: Secondary | ICD-10-CM

## 2016-02-10 DIAGNOSIS — Z36 Encounter for antenatal screening of mother: Secondary | ICD-10-CM

## 2016-02-10 DIAGNOSIS — O0993 Supervision of high risk pregnancy, unspecified, third trimester: Secondary | ICD-10-CM

## 2016-02-10 DIAGNOSIS — Z23 Encounter for immunization: Secondary | ICD-10-CM

## 2016-02-10 LAB — POCT URINALYSIS DIP (DEVICE)
BILIRUBIN URINE: NEGATIVE
Glucose, UA: NEGATIVE mg/dL
KETONES UR: NEGATIVE mg/dL
Nitrite: NEGATIVE
PH: 7 (ref 5.0–8.0)
Protein, ur: NEGATIVE mg/dL
SPECIFIC GRAVITY, URINE: 1.01 (ref 1.005–1.030)
Urobilinogen, UA: 0.2 mg/dL (ref 0.0–1.0)

## 2016-02-10 MED ORDER — INSULIN REGULAR HUMAN 100 UNIT/ML IJ SOLN: INTRAMUSCULAR | 11 refills | Status: DC

## 2016-02-10 MED ORDER — INSULIN NPH (HUMAN) (ISOPHANE) 100 UNIT/ML ~~LOC~~ SUSP: SUBCUTANEOUS | 3 refills | Status: DC

## 2016-02-10 NOTE — Progress Notes (Signed)
Prenatal Visit Note Date: 02/10/2016 [facility]  Subjective:  [name] is a 20 y.o. G1P0000 at 6116w4d being seen today for ongoing prenatal care.  She is currently monitored for the following issues for this high-risk pregnancy and has Supervision of high risk pregnancy, antepartum and Insulin controlled gestational diabetes mellitus in third trimester on her problem list.  Patient reports no complaints.   Contractions: Irritability. Vag. Bleeding: None.  Movement: Present. Denies leaking of fluid.   The following portions of the patient's history were reviewed and updated as appropriate: allergies, current medications, past family history, past medical history, past social history, past surgical history and problem list. Problem list updated.  Objective:   Vitals:   02/10/16 1001  BP: 112/73  Pulse: 89  Weight: 160 lb 6.4 oz (72.8 kg)    Fetal Status: Fetal Heart Rate (bpm): NST   Movement: Present     General:  Alert, oriented and cooperative. Patient is in no acute distress.  Skin: Skin is warm and dry. No rash noted.   Cardiovascular: Normal heart rate noted  Respiratory: Normal respiratory effort, no problems with respiration noted  Abdomen: Soft, gravid, appropriate for gestational age. Pain/Pressure: Present     Pelvic:  Cervical exam deferred        Extremities: Normal range of motion.  Edema: None  Mental Status: Normal mood and affect. Normal behavior. Normal judgment and thought content.   Urinalysis:      Assessment and Plan:  Pregnancy: G1P0000 at 5516w4d  1. Insulin controlled gestational diabetes mellitus in third trimester Patient states she is on regular 05/07/13 (about 7046m before her meals) and NPH 30/12. She states she has medicaid but maggie states she is self pay, which is why she's on regular insulin. Her logs are better but her PM 2hr PP need to be improved. D/w Maggie and she will see her again today and will increase  her regular before dinner and her AM NPH Fastings: 73, 74, 75, 80, 101, 114, 87, 84, 90, 109, 86 2hr  Breakfast: 158, 148, 195, 163, 129, 258, 108, 102, 133 Lunch 184, 175, 124, 145, 168, 181, 126, 89, 150 Dinner 270, 269, 149, 163, 180, 199, 276, 176, 176 I will increase her to NPH 35/12 and regular 05/07/17 and see her back in one week rNST and normal AFI per Diane Day today (AFI not up in the system yet) Continue 2x/week testing LGA fetus last labor curve implications d/w pt.   2. Supervision of high risk pregnancy, antepartum, third trimester GBS neg  3. Needs flu shot Done today - Flu Vaccine QUAD 36+ mos IM (Fluarix, Quad PF)  Term labor symptoms and general obstetric precautions including but not limited to vaginal bleeding, contractions, leaking of fluid and fetal movement were reviewed in detail with the patient. Please refer to After Visit Summary for other counseling recommendations.  Return in about 7 days (around 02/17/2016) for Ob fu and NST/AFI.  Interpreter used  [place] Bingharlie Skai Lickteig, MD

## 2016-02-10 NOTE — Progress Notes (Signed)
Flu vaccine today 

## 2016-02-10 NOTE — Progress Notes (Signed)
Interpreter Abby # [id number] used for encounter. US for growth done 9/12.

## 2016-02-10 NOTE — Progress Notes (Signed)
Diabetes Education: 02/10/16 Alice Velez came today for the glucose/insulin test. Fasting glucose at 11:30 was 85 mg/dl.  Took her insulins in separate injections using appropriate technique. NPH = 30 units and Regular = 12 units. Waited 20 minutes and ate her breakfast of a cup of milk and a slice of bread and cheese. At 2:00 pm her 2 hr post meal reading was 131 mg/dl.   Instructed as indicated by Dr. Tarri GlennPicken;s note to use: Use NPH in AM 35 units and Regular 12 units in the morning before breakfast. Use Regular 14 units at lunch. Use Regular 18 units at dinner. Use NPH 12 units at bedtime Maggie Deontray Hunnicutt, RN, RD, LDN

## 2016-02-10 NOTE — Addendum Note (Signed)
Addended by: Darrel HooverASSETTE, KELLY P on: 02/10/2016 03:00 PM   Modules accepted: Orders

## 2016-02-11 LAB — GC/CHLAMYDIA PROBE AMP (~~LOC~~) NOT AT ARMC
CHLAMYDIA, DNA PROBE: NEGATIVE
NEISSERIA GONORRHEA: NEGATIVE

## 2016-02-13 ENCOUNTER — Ambulatory Visit (INDEPENDENT_AMBULATORY_CARE_PROVIDER_SITE_OTHER): Payer: Self-pay | Admitting: *Deleted

## 2016-02-13 VITALS — BP 110/76 | HR 88

## 2016-02-13 DIAGNOSIS — O24414 Gestational diabetes mellitus in pregnancy, insulin controlled: Secondary | ICD-10-CM

## 2016-02-13 NOTE — Progress Notes (Signed)
IOL scheduled 9/29 @ 0700

## 2016-02-13 NOTE — Progress Notes (Signed)
NST performed today was reviewed and was found to be reactive.  Continue recommended antenatal testing and prenatal care.  

## 2016-02-14 ENCOUNTER — Encounter (HOSPITAL_COMMUNITY): Payer: Self-pay | Admitting: *Deleted

## 2016-02-14 ENCOUNTER — Telehealth (HOSPITAL_COMMUNITY): Payer: Self-pay | Admitting: *Deleted

## 2016-02-14 NOTE — Telephone Encounter (Signed)
Preadmission screen Interpreter number [id number] 

## 2016-02-14 NOTE — Telephone Encounter (Signed)
Preadmission screen  

## 2016-02-17 ENCOUNTER — Ambulatory Visit (INDEPENDENT_AMBULATORY_CARE_PROVIDER_SITE_OTHER): Payer: Self-pay | Admitting: Obstetrics and Gynecology

## 2016-02-17 ENCOUNTER — Inpatient Hospital Stay (HOSPITAL_COMMUNITY)
Admission: AD | Admit: 2016-02-17 | Discharge: 2016-02-18 | DRG: 781 | Disposition: A | Discharge status: Home / Self Care | Payer: Self-pay | Source: Ambulatory Visit | Attending: Family Medicine | Admitting: Family Medicine

## 2016-02-17 VITALS — BP 119/80 | HR 97 | Wt 160.3 lb

## 2016-02-17 DIAGNOSIS — Z36 Encounter for antenatal screening of mother: Secondary | ICD-10-CM

## 2016-02-17 DIAGNOSIS — Z3A38 38 weeks gestation of pregnancy: Secondary | ICD-10-CM

## 2016-02-17 DIAGNOSIS — O24414 Gestational diabetes mellitus in pregnancy, insulin controlled: Principal | ICD-10-CM | POA: Diagnosis present

## 2016-02-17 DIAGNOSIS — O0993 Supervision of high risk pregnancy, unspecified, third trimester: Secondary | ICD-10-CM

## 2016-02-17 DIAGNOSIS — R739 Hyperglycemia, unspecified: Secondary | ICD-10-CM | POA: Diagnosis present

## 2016-02-17 LAB — COMPREHENSIVE METABOLIC PANEL
ALT: 25 U/L (ref 14–54)
ANION GAP: 6 (ref 5–15)
AST: 24 U/L (ref 15–41)
Albumin: 3 g/dL — ABNORMAL LOW (ref 3.5–5.0)
Alkaline Phosphatase: 123 U/L (ref 38–126)
BILIRUBIN TOTAL: 0.3 mg/dL (ref 0.3–1.2)
BUN: 15 mg/dL (ref 6–20)
CO2: 23 mmol/L (ref 22–32)
Calcium: 10 mg/dL (ref 8.9–10.3)
Chloride: 106 mmol/L (ref 101–111)
Creatinine, Ser: 0.57 mg/dL (ref 0.44–1.00)
Glucose, Bld: 115 mg/dL — ABNORMAL HIGH (ref 65–99)
POTASSIUM: 4.3 mmol/L (ref 3.5–5.1)
Sodium: 135 mmol/L (ref 135–145)
TOTAL PROTEIN: 6.8 g/dL (ref 6.5–8.1)

## 2016-02-17 LAB — POCT URINALYSIS DIP (DEVICE)
BILIRUBIN URINE: NEGATIVE
GLUCOSE, UA: 250 mg/dL — AB
Ketones, ur: NEGATIVE mg/dL
LEUKOCYTES UA: NEGATIVE
Nitrite: NEGATIVE
Protein, ur: 100 mg/dL — AB
SPECIFIC GRAVITY, URINE: 1.02 (ref 1.005–1.030)
Urobilinogen, UA: 0.2 mg/dL (ref 0.0–1.0)
pH: 6.5 (ref 5.0–8.0)

## 2016-02-17 LAB — GLUCOSE, CAPILLARY
GLUCOSE-CAPILLARY: 90 mg/dL (ref 65–99)
Glucose-Capillary: 227 mg/dL — ABNORMAL HIGH (ref 65–99)
Glucose-Capillary: 126 mg/dL — ABNORMAL HIGH (ref 65–99)
Glucose-Capillary: 74 mg/dL (ref 65–99)

## 2016-02-17 LAB — CBC
HEMATOCRIT: 35.7 % — AB (ref 36.0–46.0)
HEMOGLOBIN: 12.4 g/dL (ref 12.0–15.0)
MCH: 29.9 pg (ref 26.0–34.0)
MCHC: 34.7 g/dL (ref 30.0–36.0)
MCV: 86 fL (ref 78.0–100.0)
Platelets: 252 10*3/uL (ref 150–400)
RBC: 4.15 MIL/uL (ref 3.87–5.11)
RDW: 14.4 % (ref 11.5–15.5)
WBC: 10.4 10*3/uL (ref 4.0–10.5)

## 2016-02-17 LAB — TYPE AND SCREEN
ABO/RH(D): A POS
ANTIBODY SCREEN: NEGATIVE

## 2016-02-17 LAB — ABO/RH: ABO/RH(D): A POS

## 2016-02-17 MED ORDER — INSULIN NPH (HUMAN) (ISOPHANE) 100 UNIT/ML ~~LOC~~ SUSP
12.0000 [IU] | Freq: Every day | SUBCUTANEOUS | Status: DC
  Administered 2016-02-17: 12 [IU] via SUBCUTANEOUS

## 2016-02-17 MED ORDER — INSULIN ASPART 100 UNIT/ML ~~LOC~~ SOLN: 10.0000 [IU] | Freq: Once | SUBCUTANEOUS | Status: DC

## 2016-02-17 MED ORDER — INSULIN ASPART 100 UNIT/ML ~~LOC~~ SOLN
14.0000 [IU] | Freq: Every day | SUBCUTANEOUS | Status: DC
  Administered 2016-02-18: 14 [IU] via SUBCUTANEOUS

## 2016-02-17 MED ORDER — ENOXAPARIN SODIUM 40 MG/0.4ML ~~LOC~~ SOLN
40.0000 mg | SUBCUTANEOUS | Status: DC
  Administered 2016-02-17: 40 mg via SUBCUTANEOUS
  Filled 2016-02-17: qty 0.4

## 2016-02-17 MED ORDER — DOCUSATE SODIUM 100 MG PO CAPS
100.0000 mg | ORAL_CAPSULE | Freq: Every day | ORAL | Status: DC
  Administered 2016-02-18: 100 mg via ORAL
  Filled 2016-02-17: qty 1

## 2016-02-17 MED ORDER — CALCIUM CARBONATE ANTACID 500 MG PO CHEW: 2.0000 | CHEWABLE_TABLET | ORAL | Status: DC | PRN

## 2016-02-17 MED ORDER — INSULIN NPH (HUMAN) (ISOPHANE) 100 UNIT/ML ~~LOC~~ SUSP
35.0000 [IU] | Freq: Every day | SUBCUTANEOUS | Status: DC
  Administered 2016-02-18: 35 [IU] via SUBCUTANEOUS
  Filled 2016-02-17: qty 10

## 2016-02-17 MED ORDER — INSULIN ASPART 100 UNIT/ML ~~LOC~~ SOLN
10.0000 [IU] | Freq: Once | SUBCUTANEOUS | Status: AC
  Administered 2016-02-17: 10 [IU] via SUBCUTANEOUS

## 2016-02-17 MED ORDER — INSULIN ASPART 100 UNIT/ML ~~LOC~~ SOLN: 18.0000 [IU] | Freq: Every day | SUBCUTANEOUS | Status: DC

## 2016-02-17 MED ORDER — INSULIN ASPART 100 UNIT/ML ~~LOC~~ SOLN: 3.0000 [IU] | Freq: Three times a day (TID) | SUBCUTANEOUS | Status: DC

## 2016-02-17 MED ORDER — PRENATAL MULTIVITAMIN CH: 1.0000 | ORAL_TABLET | Freq: Every day | ORAL | Status: DC

## 2016-02-17 MED ORDER — INSULIN ASPART 100 UNIT/ML ~~LOC~~ SOLN: 0.0000 [IU] | Freq: Three times a day (TID) | SUBCUTANEOUS | Status: DC

## 2016-02-17 MED ORDER — PRENATAL MULTIVITAMIN CH
1.0000 | ORAL_TABLET | Freq: Every day | ORAL | Status: DC
  Administered 2016-02-18: 1 via ORAL
  Filled 2016-02-17: qty 1

## 2016-02-17 MED ORDER — FAMOTIDINE 20 MG PO TABS
20.0000 mg | ORAL_TABLET | Freq: Two times a day (BID) | ORAL | Status: DC
  Administered 2016-02-18: 20 mg via ORAL
  Filled 2016-02-17 (×2): qty 1

## 2016-02-17 MED ORDER — ASPIRIN EC 81 MG PO TBEC
81.0000 mg | DELAYED_RELEASE_TABLET | Freq: Every day | ORAL | Status: DC
  Administered 2016-02-18: 81 mg via ORAL
  Filled 2016-02-17: qty 1

## 2016-02-17 MED ORDER — ACETAMINOPHEN 325 MG PO TABS: 650.0000 mg | ORAL_TABLET | ORAL | Status: DC | PRN

## 2016-02-17 MED ORDER — ZOLPIDEM TARTRATE 5 MG PO TABS: 5.0000 mg | ORAL_TABLET | Freq: Every evening | ORAL | Status: DC | PRN

## 2016-02-17 NOTE — Progress Notes (Signed)
Prenatal Visit Note Date: 02/17/2016 [facility]  Subjective:  Alice BroachRosa Velez Velez is a 20 y.o. G1P0000 at 2075w4d being seen today for ongoing prenatal care.  She is currently monitored for the following issues for this high-risk pregnancy and has Supervision of high risk pregnancy, antepartum and Insulin controlled gestational diabetes mellitus in third trimester on her problem list.  Patient reports no complaints.   Contractions: Irregular. Vag. Bleeding: None.  Movement: Present. Denies leaking of fluid.   The following portions of the patient's history were reviewed and updated as appropriate: allergies, current medications, past family history, past medical history, past social history, past surgical history and problem list. Problem list updated.  Objective:   Vitals:   02/17/16 1307  BP: 119/80  Pulse: 97  Weight: 160 lb 4.8 oz (72.7 kg)    Fetal Status: Fetal Heart Rate (bpm): rNST   Movement: Present  Presentation: Vertex  General:  Alert, oriented and cooperative. Patient is in no acute distress.  Skin: Skin is warm and dry. No rash noted.   Cardiovascular: Normal heart rate noted  Respiratory: Normal respiratory effort, no problems with respiration noted  Abdomen: Soft, gravid, appropriate for gestational age. Pain/Pressure: Present     Pelvic:  Cervical exam deferred        Extremities: Normal range of motion.  Edema: None  Mental Status: Normal mood and affect. Normal behavior. Normal judgment and thought content.   Urinalysis: Urine Protein: 1+ Urine Glucose: 2+  Assessment and Plan:  Pregnancy: G1P0000 at 5775w4d  1. Insulin controlled gestational diabetes mellitus in third trimester Poor control on NPH and regular insulin regimen. BS now is 227 and patient states she ate and took insulin at 10am today rNST and AFI LLN @ 6. Will admit to [facility] directly (room 9157) and d/w RN and attending on call. Pt didn't bring her insulin with her today  so will have her go up there to get BS under control. Patient states she's on NPH 30/12 and Regular 05/07/17 Fasting: 60s-80s 2hr PP -163, 111, 169, 102, 133, 100, 85, 149, 195, 114 -193, 232, 185, 252, 184, 171, 139  -154, 143, 148, 276, 176, 176, 258, 191, 167, 133 - Amniotic fluid index with NST  2. Supervision of high risk pregnancy, antepartum, third trimester See above  Term labor symptoms and general obstetric precautions including but not limited to vaginal bleeding, contractions, leaking of fluid and fetal movement were reviewed in detail with the patient. Please refer to After Visit Summary for other counseling recommendations.  Return in about 6 weeks (around 03/30/2016) for PP visit. w/2hr GTT - must be fasting and must be in 6 wks  IOL on 9/29.   Chim[place]o Bingharlie Cailan Antonucci, MD

## 2016-02-17 NOTE — Progress Notes (Signed)
I assisted RN with admission questions, ordered patient meals, I assisted RN to explain plan of care during night shift, by Orlan LeavensViria Alvarez Spanish Interpreter.

## 2016-02-17 NOTE — H&P (Signed)
Faculty Practice Antenatal History and Physical  Shaunta Oncale ZOX:[id number] DOB: [birth year] DOA: 02/17/2016  Chief Complaint: High blood sugars  HPI: Alice Velez is a 20 y.o. female G1P0000 with IUP at [redacted]w[redacted]d presenting for blood sugar control. She was seen in the office for a routine prenatal appointment.  Her blood sugar logbook reported blood sugars in the 180-200 range after lunch and dinner.  Her fasting blood sugars appear controlled, as does most of her blood sugars after breakfast.  She reports no symptoms.  Good fetal movement, no contractions.  She is scheduled for induction at 39 weeks.   Review of Systems:  Pt denies any other symptoms.  Review of systems are otherwise negative  Prenatal History/Complications: Gestational diabetes  Past Medical History:     Past Medical History:  Diagnosis Date  . Gestational diabetes    insulin  . Ovarian cyst     Past Surgical History:      Past Surgical History:  Procedure Laterality Date  . NO PAST SURGERIES      Obstetrical History:         OB History    Gravida Para Term Preterm AB Living   1 0 0 0 0      SAB TAB Ectopic Multiple Live Births   0 0 0           Gynecological History:         OB History    Gravida Para Term Preterm AB Living   1 0 0 0 0      SAB TAB Ectopic Multiple Live Births   0 0 0           Social History: Social History        Social History  . Marital status: Single    Spouse name: N/A  . Number of children: N/A  . Years of education: N/A        Social History Main Topics  . Smoking status: Never Smoker  . Smokeless tobacco: Never Used  . Alcohol use No  . Drug use: No  . Sexual activity: Yes    Birth control/ protection: None       Other Topics Concern  . Not on file      Social History Narrative   ** Merged History Encounter **        Family History: No family history on file.  Allergies: No Known  Allergies         Prescriptions Prior to Admission  Medication Sig Dispense Refill Last Dose  . Alcohol Swabs (ALCOHOL PREP) PADS 1 each by Does not apply route 6 (six) times daily. 100 each 6 Taking  . aspirin EC 81 MG tablet Take 1 tablet (81 mg total) by mouth daily. Take after 12 weeks for prevention of preeclampsia later in pregnancy 300 tablet 2 Taking  . doxylamine, Sleep, (UNISOM) 25 MG tablet Take 1 tablet (25 mg total) by mouth at bedtime as needed. (Patient not taking: Reported on 02/17/2016) 30 tablet 0 Not Taking  . insulin NPH Human (HUMULIN N,NOVOLIN N) 100 UNIT/ML injection 35 units in the AM and 12 units at bedtime (Patient not taking: Reported on 02/17/2016) 10 mL 3 Not Taking  . insulin regular (NOVOLIN R) 100 units/mL injection 14 units before breakfast; 14 units before lunch and 18 units before dinner 10 mL 11 Taking  . Insulin Syringe-Needle U-100 (RELION INSULIN SYRINGE) 31G X 15/64" 0.3 ML MISC 1 each by Does not  apply route 6 (six) times daily. 100 each 12 Taking  . Prenatal Vit-Fe Fumarate-FA (PRENATAL MULTIVITAMIN) TABS tablet Take 1 tablet by mouth daily at 12 noon.   Taking  . ranitidine (ZANTAC) 150 MG tablet Take 1 tablet (150 mg total) by mouth 2 (two) times daily. 60 tablet 0 Taking    Physical Exam: BP 131/79   Pulse 85   Temp 99 F (37.2 C) (Oral)   Resp 20   LMP 05/23/2015   BP 131/79   Pulse 85   Temp 99 F (37.2 C) (Oral)   Resp 20   LMP 05/23/2015  General appearance: alert, cooperative and no distress Head: Normocephalic, without obvious abnormality, atraumatic Neck: no adenopathy, no carotid bruit, no JVD, supple, symmetrical, trachea midline and thyroid not enlarged, symmetric, no tenderness/mass/nodules Lungs: Normal effort Abdomen: soft, non-tender; bowel sounds normal; no masses,  no organomegaly. Gravid to term. Extremities: extremities normal, atraumatic, no cyanosis or edema and tenderness Pulses: 2+ and symmetric Skin: Skin  color, texture, turgor normal. No rashes or lesions cephalic Baseline: 140 bpm, Variability: Good {> 6 bpm), Accelerations: Reactive and Decelerations: Absent None             Labs on Admission:  Basic Metabolic Panel:  Last Labs    Recent Labs Lab 02/17/16 1454  NA 135  K 4.3  CL 106  CO2 23  GLUCOSE 115*  BUN 15  CREATININE 0.57  CALCIUM 10.0     Liver Function Tests:  Last Labs    Recent Labs Lab 02/17/16 1454  AST 24  ALT 25  ALKPHOS 123  BILITOT 0.3  PROT 6.8  ALBUMIN 3.0*     Last Labs   No results for input(s): LIPASE, AMYLASE in the last 168 hours.   Last Labs   No results for input(s): AMMONIA in the last 168 hours.   CBC:  Last Labs    Recent Labs Lab 02/17/16 1454  WBC 10.4  HGB 12.4  HCT 35.7*  MCV 86.0  PLT 252      CBG:  Last Labs    Recent Labs Lab 02/17/16 1321 02/17/16 1452  GLUCAP 227* 126*      Radiological Exams on Admission: Imaging Results (Last 48 hours)  No results found.     Assessment/Plan Present on Admission: . Hyperglycemia . Insulin controlled gestational diabetes mellitus in third trimester  1. Hyperglycemia  CBG here 126, although last meal was this AM  Carb modified diet.  CBG AC and PC.  Will place on NPH 35 units in AM and 18 units QHS.  Change Regular insulin to aspart 14/14/18.  SSI premeal   NST daily 2. Insulin controlled GDM  Delivery at 39 weeks   Levie HeritageJacob J Stinson, DO 02/17/2016 3:46 PM Faculty Practice Attending Physician [facility] Attending Phone #: [phone]

## 2016-02-17 NOTE — Progress Notes (Signed)
Video interpreter Dorene Grebeatalie [phone]#[id number] used for encounter.  Pt has not been taking prescribed dose of NPH insulin in am because her syringes are too small.  She has been taking 30 units instead of 35 as prescribed.

## 2016-02-17 NOTE — Progress Notes (Addendum)
Pt is G1P) @ 38+[redacted] wksga. Presents to ward room 156 via wheelchair from MAU waiting. Admitted for uncontrolled Dm. BS 215 in OB office. Pt not medicated as of yet. Pending admission orders.   1447: MD at bs assessing pt. FSBS ordered. FSBS 126  1459: Lab at bs.

## 2016-02-18 ENCOUNTER — Encounter (HOSPITAL_COMMUNITY): Payer: Self-pay

## 2016-02-18 DIAGNOSIS — Z3A38 38 weeks gestation of pregnancy: Secondary | ICD-10-CM

## 2016-02-18 DIAGNOSIS — O24414 Gestational diabetes mellitus in pregnancy, insulin controlled: Principal | ICD-10-CM

## 2016-02-18 LAB — GLUCOSE, CAPILLARY
GLUCOSE-CAPILLARY: 100 mg/dL — AB (ref 65–99)
GLUCOSE-CAPILLARY: 103 mg/dL — AB (ref 65–99)
Glucose-Capillary: 84 mg/dL (ref 65–99)

## 2016-02-18 MED ORDER — "INSULIN SYRINGE 30G X 1/2"" 0.5 ML MISC": 1.0000 | Freq: Two times a day (BID) | 0 refills | Status: DC

## 2016-02-18 NOTE — Progress Notes (Signed)
Patient has received discharge orders to home.  All questions and concerns have been answered via translator.  Patient is waiting on transportation to home.  Lunch has been ordered.

## 2016-02-18 NOTE — Discharge Instructions (Signed)
Diabetes mellitus gestacional (Gestational Diabetes Mellitus) La diabetes mellitus gestacional, ms comnmente conocida como diabetes gestacional es un tipo de diabetes que desarrollan algunas mujeres durante el embarazo. En la diabetes gestacional, el pncreas no produce suficiente insulina (una hormona) o las clulas son menos sensibles a la insulina producida (resistencia a la insulina), o ambas cosas. Normalmente, la insulina mueve los azcares de los alimentos a las clulas de los tejidos. Las clulas de los tejidos utilizan los azcares para obtener energa. La falta de insulina o la falta de una respuesta normal a la insulina hace que el exceso de azcar se acumule en la sangre en lugar de penetrar en las clulas de los tejidos. Como resultado, se producen niveles altos de azcar en la sangre (hiperglucemia). El efecto de los niveles altos de azcar (glucosa) puede causar muchos problemas.  FACTORES DE RIESGO Usted tiene mayor probabilidad de desarrollar diabetes gestacional si tiene antecedentes familiares de diabetes y tambin si tiene uno o ms de los siguientes factores de riesgo:  ndice de masa corporal superior a 30 (obesidad).  Embarazo previo con diabetes gestacional.  La edad avanzada en el momento del embarazo. Si se mantienen los niveles de glucosa en la sangre en un rango normal durante el embarazo, las mujeres pueden tener un embarazo saludable. Si los niveles de glucosa en la sangre no estn bien controlados, puede haber riesgos para usted, el feto o el recin nacido, o durante el trabajo de parto y el parto.  SNTOMAS  Si se presentan sntomas, stos son similares a los sntomas que normalmente experimentar durante el embarazo. Los sntomas de la diabetes gestacional son:   Aumento de la sed (polidipsia).  Aumento de la miccin (poliuria).  Orina con ms frecuencia durante la noche (nocturia).  Prdida de peso. La prdida de peso puede ser muy rpida.  Infecciones  frecuentes y recurrentes.  Cansancio (fatiga).  Debilidad.  Cambios en la visin, como visin borrosa.  Olor a fruta en el aliento.  Dolor abdominal. DIAGNSTICO La diabetes se diagnostica cuando hay aumento de los niveles de glucosa en la sangre. El nivel de glucosa en la sangre puede controlarse en uno o ms de los siguientes anlisis de sangre:  Medicin de glucosa en la sangre en ayunas. No se le permitir comer durante al menos 8 horas antes de que se tome una muestra de sangre.  Pruebas al azar de glucosa en la sangre. El nivel de glucosa en la sangre se controla en cualquier momento del da sin importar el momento en que haya comido.  Prueba de tolerancia a la glucosa oral (PTGO). La glucosa en la sangre se mide despus de no haber comido (ayunas) durante una a tres horas y despus de beber una bebida que contenga glucosa. Dado que las hormonas que causan la resistencia a la insulina son ms altas alrededor de las semanas 24 a 28 de embarazo, generalmente se realiza una PTGO durante ese tiempo. Si tiene factores de riesgo, en la primera visita prenatal pueden hacerle pruebas de deteccin de diabetes tipo 2 no diagnosticada. TRATAMIENTO  La diabetes gestacional debe controlarse en primer lugar con dieta y ejercicios. Pueden agregarse medicamentos, pero solo si son necesarios.  Usted tendr que tomar medicamentos para la diabetes o insulina diariamente para mantener los niveles de glucosa en la sangre en el rango deseado.  Usted tendr que combinar la dosis de insulina con la actividad fsica y la eleccin de alimentos saludables. Si tiene diabetes gestacional, el objetivo del tratamiento ser   mantener los siguientes niveles sanguneos de glucosa:  Antes de las comidas (preprandial): valor de 95 mg/dl o inferior.  Despus de las comidas (posprandial):  Una hora despus de la comida: valor de 140 mg/dl o inferior.  Dos horas despus de la comida: valor de 120 mg/dl o  inferior. Si tiene diabetes tipo 1 o tipo 2 preexistente, el objetivo del tratamiento ser mantener los siguientes niveles sanguneos de glucosa:  Antes de las comidas, a la hora de acostarse y durante la noche: de 60 a 99 mg/dl.  Despus de las comidas: valor mximo de 100 a 129 mg/dl. INSTRUCCIONES PARA EL CUIDADO EN EL HOGAR   Controle su nivel de hemoglobina A1c dos veces al ao.  Contrlese a diario el nivel de glucosa en la sangre segn las indicaciones de su mdico. Es comn realizar controles frecuentes de la glucosa en la sangre.  Supervise las cetonas en la orina cuando est enferma y segn las indicaciones de su mdico.  Tome el medicamento para la diabetes y adminstrese insulina segn las indicaciones de su mdico para mantener el nivel de glucosa en la sangre en el rango deseado.  Nunca se quede sin medicamento para la diabetes o sin insulina. Es necesario que la reciba todos los das.  Ajuste la insulina segn la ingesta de hidratos de carbono. Los hidratos de carbono pueden aumentar los niveles de glucosa en la sangre, pero deben incluirse en su dieta. Los hidratos de carbono aportan vitaminas, minerales y fibra que son una parte esencial de una dieta saludable. Los hidratos de carbono se encuentran en frutas, verduras, cereales integrales, productos lcteos, legumbres y alimentos que contienen azcares aadidos.  Consuma alimentos saludables. Alterne 3 comidas con 3 colaciones.  Aumente de peso saludablemente. El aumento del peso total vara de acuerdo con el ndice de masa corporal que tena antes del embarazo (IMC).  Lleve una tarjeta de alerta mdica o use una pulsera o medalla de alerta mdica.  Lleve con usted una colacin de 15gramos de hidratos de carbono en todo momento para controlar los niveles bajos de glucosa en la sangre (hipoglucemia). Algunos ejemplos de colaciones de 15gramos de hidratos de carbono son los siguientes:  Tabletas de glucosa, 3 o 4.  Gel  de glucosa, tubo de 15 gramos.  Pasas de uva, 2 cucharadas (24 g).  Caramelos de goma, 6.  Galletas de animales, 8.  Jugo de fruta, gaseosa comn, o leche descremada, 4 onzas (120 ml).  Pastillas de goma, 9.  Reconocer la hipoglucemia. Durante el embarazo la hipoglucemia se produce cuando hay niveles de glucosa en la sangre de 60 mg/dl o menos. El riesgo de hipoglucemia aumenta durante el ayuno o cuando se saltea las comidas, durante o despus de realizar ejercicio intenso y mientras duerme. Los sntomas de hipoglucemia son:  Temblores o sacudidas.  Disminucin de la capacidad de concentracin.  Sudoracin.  Aumento de la frecuencia cardaca.  Dolor de cabeza.  Sequedad en la boca.  Hambre.  Irritabilidad.  Ansiedad.  Sueo agitado.  Alteracin del habla o de la coordinacin.  Confusin.  Tratar la hipoglucemia rpidamente. Si usted est alerta y puede tragar con seguridad, siga la regla de 15/15 que consiste en:  Tome entre 15 y 20gramos de glucosa de accin rpida o carbohidratos. Las opciones de accin rpida son un gel de glucosa, tabletas de glucosa, o 4 onzas (120 ml) de jugo de frutas, gaseosa comn, o leche baja en grasa.  Compruebe su nivel de glucosa en la sangre   15 minutos despus de tomar la glucosa.  Tome entre 15 y 20 gramos ms de glucosa si el nivel de glucosa en la sangre todava es de 70mg/dl o inferior.  Ingiera una comida o una colacin en el lapso de 1 hora una vez que los niveles de glucosa en la sangre vuelven a la normalidad.  Est atento a la poliuria (miccin excesiva) y la polidipsia (sensacin de mucha sed), que son los primeros signos de la hiperglucemia. El reconocimiento temprano de la hiperglucemia permite un tratamiento oportuno. Trate la hiperglucemia segn le indic su mdico.  Haga actividad fsica por lo menos 30minutos al da o como lo indique su mdico. Se recomienda que 30 minutos despus de cada comida, realice diez minutos  de actividad fsica para controlar los niveles de glucosa postprandial en la sangre.  Ajuste su dosis de insulina y la ingesta de alimentos, segn sea necesario, si inicia un nuevo ejercicio o deporte.  Siga su plan para los das de enfermedad cuando no pueda comer o beber como de costumbre.  Evite el tabaco y el alcohol.  Concurra a todas las visitas de control como se lo haya indicado el mdico.  Siga el consejo del mdico respecto a los controles prenatales y posteriores al parto (postparto), las visitas, la planificacin de las comidas, el ejercicio, los medicamentos, las vitaminas, los anlisis de sangre, otras pruebas mdicas y actividades fsicas.  Realice diariamente el cuidado de la piel y de los pies. Examine su piel y los pies diariamente para ver si tiene cortes, moretones, enrojecimiento, problemas en las uas, sangrado, ampollas o llagas.  Cepllese los dientes y encas por lo menos dos veces al da y use hilo dental al menos una vez por da. Concurra regularmente a las visitas de control con el dentista.  Programe un examen de vista durante el primer trimestre de su embarazo o como lo indique su mdico.  Comparta su plan de control de diabetes en el trabajo o en la escuela.  Mantngase al da con las vacunas.  Aprenda a manejar el estrs.  Obtenga la mayor cantidad posible de informacin sobre la diabetes y solicite ayuda siempre que sea necesario.  Obtenga informacin sobre el amamantamiento y analice esta posibilidad.  Debe controlar el nivel de azcar en la sangre de 6a 12semanas despus del parto. Esto se hace con una prueba de tolerancia a la glucosa oral (PTGO). SOLICITE ATENCIN MDICA SI:   No puede comer alimentos o beber por ms de 6 horas.  Tuvo nuseas o ha vomitado durante ms de 6 horas.  Tiene un nivel de glucosa en la sangre de 200 mg/dl y cetonas en la orina.  Presenta algn cambio en el estado mental.  Desarrolla problemas de visin.  Sufre  un dolor persistente de cabeza.  Siente dolor o molestias en la parte superior del abdomen.  Desarrolla una enfermedad grave adicional.  Tuvo diarrea durante ms de 6 horas.  Ha estado enfermo o ha tenido fiebre durante un par de das y no mejora. SOLICITE ATENCIN MDICA DE INMEDIATO SI:   Tiene dificultad para respirar.  Ya no siente los movimientos del beb.  Est sangrando o tiene flujo vaginal.  Comienza a tener contracciones o trabajo de parto prematuro. ASEGRESE DE QUE:  Comprende estas instrucciones.  Controlar su afeccin.  Recibir ayuda de inmediato si no mejora o si empeora.   Esta informacin no tiene como fin reemplazar el consejo del mdico. Asegrese de hacerle al mdico cualquier pregunta que tenga.     Document Released: 02/18/2005 Document Revised: 06/01/2014 Elsevier Interactive Patient Education 2016 [employer]  

## 2016-02-18 NOTE — Discharge Summary (Signed)
Physician Discharge Summary  Patient ID: Alice Velez MRN: [id number] DOB/AGE: April 30, 1996 20 y.o.  Admit date: 02/17/2016 Discharge date: 02/18/2016   Discharge Diagnoses:  Active Problems:   Insulin controlled gestational diabetes mellitus in third trimester   Hyperglycemia   Consults: None  Significant Diagnostic Studies:   CBG (last 3)   Recent Labs  02/17/16 2136 02/18/16 0802 02/18/16 1008  GLUCAP 90 84 100*    CBC    Component Value Date/Time   WBC 10.4 02/17/2016 1454   RBC 4.15 02/17/2016 1454   HGB 12.4 02/17/2016 1454   HGB 12.8 08/29/2015   HCT 35.7 (L) 02/17/2016 1454   HCT 38 08/29/2015   PLT 252 02/17/2016 1454   PLT 374 08/29/2015   MCV 86.0 02/17/2016 1454   MCH 29.9 02/17/2016 1454   MCHC 34.7 02/17/2016 1454   RDW 14.4 02/17/2016 1454   LYMPHSABS 2.0 10/30/2014 0848   MONOABS 0.9 10/30/2014 0848   EOSABS 0.1 10/30/2014 0848   BASOSABS 0.0 10/30/2014 0848      Hospital Course: Admitted for glycemic control prior to IOL. Patient did not have syringe large enough for her am NPH dosage. This was fixed. Placed on home regimen and BS was well controlled. IOL not available for 3 more days and CBG well controlled, will discharge home. Glycemic control with proper dosage of insulin and maintenance of diet emphasized.  Discharge exam: Blood pressure 129/78, pulse 74, temperature 98.5 F (36.9 C), temperature source Oral, resp. rate 18, height 4\' 11"  (1.499 m), weight 160 lb 7.7 oz (72.8 kg), last menstrual period 05/23/2015, SpO2 100 %. Physical Examination: General appearance - alert, well appearing, and in no distress Chest - normal effort Abdomen - gravid, NT Extremities - Homan's sign negative bilaterally Skin - normal coloration and turgor, no rashes, no suspicious skin lesions noted  Fetal Monitoring:  Baseline: 135 bpm, Variability: Good {> 6 bpm), Accelerations: Reactive and Decelerations: Absent  Disposition: 01-Home or Self  Care  Discharged Condition: fair  Discharge Instructions    Discharge activity:  Bathroom / Shower only    Complete by:  As directed    Discharge activity: Bedrest    Complete by:  As directed    Discharge diet:  No restrictions    Complete by:  As directed    Fetal Kick Count:  Lie on our left side for one hour after a meal, and count the number of times your baby kicks.  If it is less than 5 times, get up, move around and drink some juice.  Repeat the test 30 minutes later.  If it is still less than 5 kicks in an hour, notify your doctor.    Complete by:  As directed    LABOR:  When conractions begin, you should start to time them from the beginning of one contraction to the beginning  of the next.  When contractions are 5 - 10 minutes apart or less and have been regular for at least an hour, you should call your health care provider.    Complete by:  As directed    No sexual activity restrictions    Complete by:  As directed    Notify physician for bleeding from the vagina    Complete by:  As directed    Notify physician for blurring of vision or spots before the eyes    Complete by:  As directed    Notify physician for chills or fever    Complete by:  As directed    Notify physician for fainting spells, "black outs" or loss of consciousness    Complete by:  As directed    Notify physician for increase in vaginal discharge    Complete by:  As directed    Notify physician for leaking of fluid    Complete by:  As directed    Notify physician for pain or burning when urinating    Complete by:  As directed    Notify physician for pelvic pressure (sudden increase)    Complete by:  As directed    Notify physician for severe or continued nausea or vomiting    Complete by:  As directed    Notify physician for sudden gushing of fluid from the vagina (with or without continued leaking)    Complete by:  As directed    Notify physician for sudden, constant, or occasional abdominal pain     Complete by:  As directed    Notify physician if baby moving less than usual    Complete by:  As directed        Medication List    STOP taking these medications   Insulin Syringe-Needle U-100 31G X 15/64" 0.3 ML Misc Commonly known as:  RELION INSULIN SYRINGE Replaced by:  INSULIN SYRINGE .5CC/30GX1/2" 30G X 1/2" 0.5 ML Misc     TAKE these medications   Alcohol Prep Pads 1 each by Does not apply route 6 (six) times daily.   aspirin EC 81 MG tablet Take 1 tablet (81 mg total) by mouth daily. Take after 12 weeks for prevention of preeclampsia later in pregnancy   insulin NPH Human 100 UNIT/ML injection Commonly known as:  HUMULIN N,NOVOLIN N 35 units in the AM and 12 units at bedtime What changed:  how much to take  how to take this  when to take this  additional instructions   insulin regular 100 units/mL injection Commonly known as:  NOVOLIN R 14 units before breakfast; 14 units before lunch and 18 units before dinner What changed:  additional instructions   INSULIN SYRINGE .5CC/30GX1/2" 30G X 1/2" 0.5 ML Misc 1 Syringe by Does not apply route 2 (two) times daily. Replaces:  Insulin Syringe-Needle U-100 31G X 15/64" 0.3 ML Misc   prenatal multivitamin Tabs tablet Take 1 tablet by mouth daily at 12 noon.   ranitidine 150 MG tablet Commonly known as:  ZANTAC Take 1 tablet (150 mg total) by mouth 2 (two) times daily.      Follow-up Information    [facility]   Specialty:  Obstetrics and Gynecology Why:  for postpartum check Contact information: [address] [place] [id number] [phone]          Signed: Reva Boresanya S Elowyn Raupp 02/18/2016, 10:11 AM

## 2016-02-20 ENCOUNTER — Other Ambulatory Visit: Payer: Self-pay

## 2016-02-21 ENCOUNTER — Encounter (HOSPITAL_COMMUNITY): Payer: Self-pay

## 2016-02-21 ENCOUNTER — Inpatient Hospital Stay (HOSPITAL_COMMUNITY)
Admission: RE | Admit: 2016-02-21 | Discharge: 2016-02-26 | DRG: 765 | Disposition: A | Discharge status: Home / Self Care | Payer: Medicaid Other | Source: Ambulatory Visit | Attending: Family Medicine | Admitting: Family Medicine

## 2016-02-21 DIAGNOSIS — O9902 Anemia complicating childbirth: Secondary | ICD-10-CM | POA: Diagnosis present

## 2016-02-21 DIAGNOSIS — K567 Ileus, unspecified: Secondary | ICD-10-CM | POA: Diagnosis not present

## 2016-02-21 DIAGNOSIS — O24424 Gestational diabetes mellitus in childbirth, insulin controlled: Principal | ICD-10-CM | POA: Diagnosis present

## 2016-02-21 DIAGNOSIS — O24419 Gestational diabetes mellitus in pregnancy, unspecified control: Secondary | ICD-10-CM

## 2016-02-21 DIAGNOSIS — D649 Anemia, unspecified: Secondary | ICD-10-CM | POA: Diagnosis present

## 2016-02-21 DIAGNOSIS — Z3A39 39 weeks gestation of pregnancy: Secondary | ICD-10-CM

## 2016-02-21 DIAGNOSIS — K9189 Other postprocedural complications and disorders of digestive system: Secondary | ICD-10-CM | POA: Diagnosis not present

## 2016-02-21 DIAGNOSIS — O0993 Supervision of high risk pregnancy, unspecified, third trimester: Secondary | ICD-10-CM

## 2016-02-21 HISTORY — DX: Other specified health status: Z78.9

## 2016-02-21 LAB — CBC
HEMATOCRIT: 36.8 % (ref 36.0–46.0)
Hemoglobin: 12.6 g/dL (ref 12.0–15.0)
MCH: 29.9 pg (ref 26.0–34.0)
MCHC: 34.2 g/dL (ref 30.0–36.0)
MCV: 87.4 fL (ref 78.0–100.0)
Platelets: 273 10*3/uL (ref 150–400)
RBC: 4.21 MIL/uL (ref 3.87–5.11)
RDW: 14.7 % (ref 11.5–15.5)
WBC: 10.5 10*3/uL (ref 4.0–10.5)

## 2016-02-21 LAB — GLUCOSE, CAPILLARY
GLUCOSE-CAPILLARY: 70 mg/dL (ref 65–99)
GLUCOSE-CAPILLARY: 138 mg/dL — AB (ref 65–99)
Glucose-Capillary: 75 mg/dL (ref 65–99)
Glucose-Capillary: 160 mg/dL — ABNORMAL HIGH (ref 65–99)
Glucose-Capillary: 122 mg/dL — ABNORMAL HIGH (ref 65–99)

## 2016-02-21 LAB — TYPE AND SCREEN
ABO/RH(D): A POS
Antibody Screen: NEGATIVE

## 2016-02-21 LAB — RPR: RPR Ser Ql: NONREACTIVE

## 2016-02-21 MED ORDER — INSULIN REGULAR HUMAN 100 UNIT/ML IJ SOLN
INTRAMUSCULAR | Status: DC
  Filled 2016-02-21: qty 2.5

## 2016-02-21 MED ORDER — MISOPROSTOL 25 MCG QUARTER TABLET
25.0000 ug | ORAL_TABLET | ORAL | Status: DC | PRN
  Administered 2016-02-21 (×2): 25 ug via VAGINAL
  Filled 2016-02-21 (×2): qty 0.25

## 2016-02-21 MED ORDER — TERBUTALINE SULFATE 1 MG/ML IJ SOLN: 0.2500 mg | Freq: Once | INTRAMUSCULAR | Status: DC | PRN

## 2016-02-21 MED ORDER — DEXTROSE-NACL 5-0.45 % IV SOLN: INTRAVENOUS | Status: DC

## 2016-02-21 MED ORDER — OXYCODONE-ACETAMINOPHEN 5-325 MG PO TABS: 2.0000 | ORAL_TABLET | ORAL | Status: DC | PRN

## 2016-02-21 MED ORDER — OXYCODONE-ACETAMINOPHEN 5-325 MG PO TABS: 1.0000 | ORAL_TABLET | ORAL | Status: DC | PRN

## 2016-02-21 MED ORDER — DEXTROSE 50 % IV SOLN: 25.0000 mL | INTRAVENOUS | Status: DC | PRN

## 2016-02-21 MED ORDER — ONDANSETRON HCL 4 MG/2ML IJ SOLN: 4.0000 mg | Freq: Four times a day (QID) | INTRAMUSCULAR | Status: DC | PRN

## 2016-02-21 MED ORDER — FLEET ENEMA 7-19 GM/118ML RE ENEM: 1.0000 | ENEMA | RECTAL | Status: DC | PRN

## 2016-02-21 MED ORDER — LACTATED RINGERS IV SOLN
500.0000 mL | INTRAVENOUS | Status: DC | PRN
  Administered 2016-02-22: 200 mL via INTRAVENOUS
  Administered 2016-02-22 – 2016-02-23 (×3): 250 mL via INTRAVENOUS
  Administered 2016-02-23: 500 mL via INTRAVENOUS

## 2016-02-21 MED ORDER — ACETAMINOPHEN 325 MG PO TABS: 650.0000 mg | ORAL_TABLET | ORAL | Status: DC | PRN

## 2016-02-21 MED ORDER — LIDOCAINE HCL (PF) 1 % IJ SOLN: 30.0000 mL | INTRAMUSCULAR | Status: DC | PRN

## 2016-02-21 MED ORDER — ZOLPIDEM TARTRATE 5 MG PO TABS: 5.0000 mg | ORAL_TABLET | Freq: Every evening | ORAL | Status: DC | PRN

## 2016-02-21 MED ORDER — SODIUM CHLORIDE 0.9 % IV SOLN: INTRAVENOUS | Status: DC

## 2016-02-21 MED ORDER — OXYTOCIN BOLUS FROM INFUSION: 500.0000 mL | Freq: Once | INTRAVENOUS | Status: DC

## 2016-02-21 MED ORDER — OXYTOCIN 40 UNITS IN LACTATED RINGERS INFUSION - SIMPLE MED: 2.5000 [IU]/h | INTRAVENOUS | Status: DC

## 2016-02-21 MED ORDER — INSULIN REGULAR BOLUS VIA INFUSION
0.0000 [IU] | Freq: Three times a day (TID) | INTRAVENOUS | Status: DC
  Filled 2016-02-21: qty 10

## 2016-02-21 MED ORDER — SOD CITRATE-CITRIC ACID 500-334 MG/5ML PO SOLN
30.0000 mL | ORAL | Status: DC | PRN
Start: 2016-02-21 — End: 2016-02-23
  Administered 2016-02-23 (×2): 30 mL via ORAL
  Filled 2016-02-21 (×2): qty 15

## 2016-02-21 MED ORDER — LACTATED RINGERS IV SOLN
INTRAVENOUS | Status: DC
  Administered 2016-02-21 – 2016-02-23 (×6): via INTRAVENOUS

## 2016-02-21 MED ORDER — FENTANYL CITRATE (PF) 100 MCG/2ML IJ SOLN: 50.0000 ug | INTRAMUSCULAR | Status: DC | PRN

## 2016-02-21 NOTE — Anesthesia Pain Management Evaluation Note (Signed)
  [facility] Pain Management Visit Note  Patient: Don BroachRosa Romero Vasquez, 20 y.o., female  "Hello [facility] am a [facility]. We have an anesthesia team available at all times to provide care throughout the hospital, including epidural management and anesthesia for C-section. I don't know your plan for the delivery whether it a natural birth, water birth, IV sedation, nitrous supplementation, doula or epidural, but we want to meet your pain goals."   1.Was your pain managed to your expectations on prior hospitalizations?   No prior hospitalizations  2.What is your expectation for pain management during this hospitalization?     Epidural  3.How can we help you reach that goal? unsure  Record the patient's initial score and the patient's pain goal.   Pain: 0  Pain Goal: 5 [facility] wants you to be able to say your pain was always managed very well.  Cephus ShellingBURGER,Dixie Coppa 02/21/2016

## 2016-02-21 NOTE — Progress Notes (Signed)
Patient ID: Don BroachRosa Romero Vasquez, female   DOB: Nov 09, 1995, 20 y.o.   MRN: [id number] Not feeling crampy; s/p cytotec x 2  VSS, afeb FHR 130s, +accels, no decels Ctx irreg q 1-4 min Cx post 1/60/-3  IUP@term  GDM A2 Unfavorable cx  Foley bulb placed without difficulty  Cam HaiSHAW, Laury Huizar CNM 02/21/2016 5:14 PM

## 2016-02-21 NOTE — H&P (Signed)
Alice Velez is a 20 y.o. female G1 @ 39.0wks by LMP and 6wk scan presenting for IOL due to GDM A2 (possibly B as was dx prior to 20wks). [facility] and has been unremarkable other than the DM. The EFW is 88th%. Insulin requirements: NPH 35 am, 12u hs; reg 14/14/18   OB History    Gravida Para Term Preterm AB Living   1 0 0 0 0 0   SAB TAB Ectopic Multiple Live Births   0 0 0 0 0     Past Medical History:  Diagnosis Date  . Gestational diabetes    insulin  . Medical history non-contributory   . Ovarian cyst    Past Surgical History:  Procedure Laterality Date  . NO PAST SURGERIES     Family History: family history is not on file. Social History:  reports that she has never smoked. She has never used smokeless tobacco. She reports that she does not drink alcohol or use drugs.     Maternal Diabetes: Yes:  Diabetes Type:  Insulin/Medication controlled Genetic Screening: Normal Maternal Ultrasounds/Referrals: Normal Fetal Ultrasounds or other Referrals:  None Maternal Substance Abuse:  No Significant Maternal Medications:  None Significant Maternal Lab Results:  Lab values include: Group B Strep negative Other Comments:  None  ROS History Dilation: 1 Effacement (%): 60 Station: -3 Exam by:: Enis SlipperJane Bailey, RN Blood pressure 120/68, pulse 80, temperature 98.7 F (37.1 C), temperature source Oral, resp. rate 20, height 4\' 11"  (1.499 m), weight 72.6 kg (160 lb), last menstrual period 05/23/2015. Exam Physical Exam  Constitutional: She is oriented to person, place, and time. She appears well-developed.  HENT:  Head: Normocephalic.  Neck: Normal range of motion.  Cardiovascular: Normal rate.   Respiratory: Effort normal.  GI:  EFM 120s, +accels, no decels Irreg mild ctx  Musculoskeletal: Normal range of motion.  Neurological: She is alert and oriented to person, place, and time.  Skin: Skin is warm and dry.  Psychiatric: She has a normal  mood and affect. Her behavior is normal. Thought content normal.    CBG: 75  Prenatal labs: ABO, Rh: --/--/A POS (09/29 0840) Antibody: NEG (09/29 0840) Rubella: Immune (04/06 0000) RPR: NON REAC (07/17 1257)  HBsAg: Negative (04/06 0000)  HIV: NONREACTIVE (07/17 1257)  GBS: Negative (09/11 0000)   Assessment/Plan: IUP@39 .1wks GDM A2 (possibly B) Unfavorable cx  [facility] Plan cytotec for cx ripening followed by foley/Pit Glucostabilizer as needed in labor; CBGs q 4h in the meantime Anticipate SVD   Pablo Stauffer 02/21/2016, 2:32 PM

## 2016-02-22 ENCOUNTER — Inpatient Hospital Stay (HOSPITAL_COMMUNITY): Payer: Medicaid Other | Admitting: Anesthesiology

## 2016-02-22 LAB — GLUCOSE, CAPILLARY
GLUCOSE-CAPILLARY: 102 mg/dL — AB (ref 65–99)
GLUCOSE-CAPILLARY: 111 mg/dL — AB (ref 65–99)
GLUCOSE-CAPILLARY: 142 mg/dL — AB (ref 65–99)
GLUCOSE-CAPILLARY: 78 mg/dL (ref 65–99)
Glucose-Capillary: 102 mg/dL — ABNORMAL HIGH (ref 65–99)
Glucose-Capillary: 138 mg/dL — ABNORMAL HIGH (ref 65–99)
Glucose-Capillary: 132 mg/dL — ABNORMAL HIGH (ref 65–99)
Glucose-Capillary: 166 mg/dL — ABNORMAL HIGH (ref 65–99)

## 2016-02-22 MED ORDER — EPHEDRINE 5 MG/ML INJ: 10.0000 mg | INTRAVENOUS | Status: DC | PRN

## 2016-02-22 MED ORDER — TERBUTALINE SULFATE 1 MG/ML IJ SOLN: 0.2500 mg | Freq: Once | INTRAMUSCULAR | Status: DC | PRN

## 2016-02-22 MED ORDER — PHENYLEPHRINE 40 MCG/ML (10ML) SYRINGE FOR IV PUSH (FOR BLOOD PRESSURE SUPPORT)
80.0000 ug | PREFILLED_SYRINGE | INTRAVENOUS | Status: DC | PRN
  Filled 2016-02-22 (×2): qty 10

## 2016-02-22 MED ORDER — LIDOCAINE HCL (PF) 1 % IJ SOLN
INTRAMUSCULAR | Status: DC | PRN
  Administered 2016-02-22: 6 mL via EPIDURAL
  Administered 2016-02-22: 4 mL

## 2016-02-22 MED ORDER — EPHEDRINE 5 MG/ML INJ
10.0000 mg | INTRAVENOUS | Status: DC | PRN
Start: 2016-02-22 — End: 2016-02-23

## 2016-02-22 MED ORDER — DIPHENHYDRAMINE HCL 50 MG/ML IJ SOLN: 12.5000 mg | INTRAMUSCULAR | Status: DC | PRN

## 2016-02-22 MED ORDER — FENTANYL CITRATE (PF) 100 MCG/2ML IJ SOLN
100.0000 ug | INTRAMUSCULAR | Status: DC | PRN
  Administered 2016-02-22 (×7): 100 ug via INTRAVENOUS
  Filled 2016-02-22 (×7): qty 2

## 2016-02-22 MED ORDER — FENTANYL 2.5 MCG/ML BUPIVACAINE 1/10 % EPIDURAL INFUSION (WH - ANES)
14.0000 mL/h | INTRAMUSCULAR | Status: DC | PRN
  Administered 2016-02-22 – 2016-02-23 (×2): 14 mL/h via EPIDURAL
  Filled 2016-02-22 (×2): qty 125

## 2016-02-22 MED ORDER — OXYTOCIN 40 UNITS IN LACTATED RINGERS INFUSION - SIMPLE MED
1.0000 m[IU]/min | INTRAVENOUS | Status: DC
  Administered 2016-02-22: 2 m[IU]/min via INTRAVENOUS
  Filled 2016-02-22: qty 1000

## 2016-02-22 MED ORDER — LACTATED RINGERS IV SOLN
500.0000 mL | Freq: Once | INTRAVENOUS | Status: AC
  Administered 2016-02-22: 500 mL via INTRAVENOUS

## 2016-02-22 MED ORDER — PHENYLEPHRINE 40 MCG/ML (10ML) SYRINGE FOR IV PUSH (FOR BLOOD PRESSURE SUPPORT)
80.0000 ug | PREFILLED_SYRINGE | INTRAVENOUS | Status: DC | PRN
  Administered 2016-02-23: 80 ug via INTRAVENOUS
  Filled 2016-02-22: qty 10

## 2016-02-22 NOTE — Progress Notes (Signed)
Patient ID: Alice BroachRosa Romero Velez, female   DOB: 07-02-1995, 20 y.o.   MRN: [id number]  Alice BroachRosa Romero Velez is a 20 y.o. G1P0000 at 3217w2d admitted for induction of labor due to A2DM.  Subjective: Uncomfortable w/ uc's, getting iv pain meds, not wanting an epidural  Objective: BP (!) 150/78   Pulse 65   Temp 98.3 F (36.8 C) (Oral)   Resp 18   Ht 4\' 11"  (1.499 m)   Wt 72.6 kg (160 lb)   LMP 05/23/2015   BMI 32.32 kg/m  No intake/output data recorded.  BP was taken during uc  FHT:  FHR: 155 bpm, variability: moderate,  accelerations:  Present,  decelerations:  Random prolonged x 1, resolved spontaneously UC:   regular, every 2-3 minutes  SVE:   Dilation: 7 Effacement (%): 80 Station: -2 Exam by:: J.Thornton, RN   Pitocin @ 6 mu/min  Labs: Lab Results  Component Value Date   WBC 10.5 02/21/2016   HGB 12.6 02/21/2016   HCT 36.8 02/21/2016   MCV 87.4 02/21/2016   PLT 273 02/21/2016    Assessment / Plan: Induction of labor due to A2DM,  progressing well on pitocin, offered AROM- wants to wait  Labor: Progressing normally Fetal Wellbeing:  Category II Pain Control:  IV pain meds Pre-eclampsia: n/a I/D:  n/a Anticipated MOD:  NSVD  Marge DuncansBooker, Jaynie Hitch Randall CNM, WHNP-BC 02/22/2016, 4:21 PM

## 2016-02-22 NOTE — Progress Notes (Signed)
Patient ID: Alice Velez, female   DOB: [birth year], 20 y.o.   MRN: [id number] LABOR PROGRESS NOTE  Alice Velez is a 20 y.o. G1P0000 at 572w2d  admitted for IOL for gDMA2 (possibly class B).  Subjective: Doing well, comfortable.  Objective: BP 118/77   Pulse 76   Temp 98.3 F (36.8 C) (Oral)   Resp 18   Ht 4\' 11"  (1.499 m)   Wt 72.6 kg (160 lb)   LMP 05/23/2015   BMI 32.32 kg/m  or  Vitals:   02/21/16 1713 02/21/16 1828 02/21/16 1832 02/22/16 0012  BP: 138/77  128/71 118/77  Pulse: 85  73 76  Resp:  20  18  Temp: 98.4 F (36.9 C)   98.3 F (36.8 C)  TempSrc: Oral   Oral  Weight:      Height:         Dilation: 1 Effacement (%): 60 Cervical Position: Middle Station: -3 Presentation: Vertex Exam by:: Philipp DeputyKim Shaw, CNM FHT: 140 baseline, moderate variability, +accelerations, no decelerations Uterine activity: q2-304min  Labs: Lab Results  Component Value Date   WBC 10.5 02/21/2016   HGB 12.6 02/21/2016   HCT 36.8 02/21/2016   MCV 87.4 02/21/2016   PLT 273 02/21/2016    Patient Active Problem List   Diagnosis Date Noted  . Gestational diabetes mellitus, class A2 02/21/2016  . Hyperglycemia 02/17/2016  . Supervision of high risk pregnancy, antepartum 10/14/2015  . Insulin controlled gestational diabetes mellitus in third trimester 10/14/2015    Assessment / Plan: 20 y.o. G1P0000 at 6972w2d here for IOL for gDMA2 (vs class B).  Labor: FB in Fetal Wellbeing:  Category I Pain Control:  Labor support  Anticipated MOD:  SVD  Leland HerElsia J Freya Zobrist, DO PGY-1 9/30/20172:19 AM

## 2016-02-22 NOTE — Progress Notes (Signed)
Patient ID: Alice BroachRosa Romero Velez, female   DOB: 1996/05/07, 20 y.o.   MRN: [id number] Alice BroachRosa Romero Velez is a 20 y.o. G1P0000 at 4662w2d admitted for induction of labor due to A2DM. Denies ha, visual changes, ruq/epigastric pain, n/v.    Subjective: Just got epidural- now comfortable  Objective: BP 131/70   Pulse 86   Temp 98 F (36.7 C) (Oral)   Resp 16   Ht 4\' 11"  (1.499 m)   Wt 72.6 kg (160 lb)   LMP 05/23/2015   SpO2 99%   BMI 32.32 kg/m  No intake/output data recorded.  FHT:  FHR: 145 bpm, variability: moderate,  accelerations:  Present,  decelerations:  Present occ variable UC:   q 2-593mins  SVE:  8/80/0, vtx, feels LOT, pelvis feels adequate EFW ~ 8.5-9lb IUPC placed Pitocin @ 12 mu/min  Labs: Lab Results  Component Value Date   WBC 10.5 02/21/2016   HGB 12.6 02/21/2016   HCT 36.8 02/21/2016   MCV 87.4 02/21/2016   PLT 273 02/21/2016    Assessment / Plan: IOL d/t A2DM, no change from last exam, IUPC placed- increase pitocin as need to acheive adequate mvu's/labor, to get in exaggerated sims w/ peanut ball. Updated Dr. Shawnie PonsPratt  Labor: slow Fetal Wellbeing:  Category II Pain Control:  Epidural Pre-eclampsia: few elevated bp's- will get pre-e labs I/D:  n/a Anticipated MOD:  NSVD  Marge DuncansBooker, Shavontae Gibeault Randall CNM, WHNP-BC 02/22/2016, 11:35 PM

## 2016-02-22 NOTE — Anesthesia Procedure Notes (Signed)

## 2016-02-22 NOTE — Progress Notes (Signed)
Patient ID: Alice BroachRosa Romero Velez, female   DOB: [birth year], 20 y.o.   MRN: [id number] Alice BroachRosa Romero Velez is a 20 y.o. G1P0000 at 2394w2d admitted for induction of labor due to A2DM.  Subjective: Doing well, no complaints  Objective: BP 122/87   Pulse 77   Temp 98.6 F (37 C) (Oral)   Resp 16   Ht 4\' 11"  (1.499 m)   Wt 72.6 kg (160 lb)   LMP 05/23/2015   BMI 32.32 kg/m  No intake/output data recorded.  FHT:  FHR: 140 bpm, variability: moderate,  accelerations:  Present,  decelerations:  Absent UC:   irregular  SVE:   Dilation: 5 Effacement (%): 70 Station: -2 Exam by:: J.Thornton, RN  Foley bulb now out  Labs: Lab Results  Component Value Date   WBC 10.5 02/21/2016   HGB 12.6 02/21/2016   HCT 36.8 02/21/2016   MCV 87.4 02/21/2016   PLT 273 02/21/2016    Assessment / Plan: s/p cytotec x 2, foley bulb now out, begin pitocin per protocol, cbg's stable, hasn't required glucostabilizer  Labor: s/p cervical ripening Fetal Wellbeing:  Category I Pain Control:  n/a Pre-eclampsia: n/a I/D:  n/a Anticipated MOD:  NSVD  Marge DuncansBooker, Ines Rebel Randall CNM, WHNP-BC 02/22/2016, 1000

## 2016-02-22 NOTE — Progress Notes (Signed)
Patient ID: Alice Velez, female   DOB: 30-Jul-1995, 20 y.o.   MRN: [id number] Alice Velez is a 20 y.o. G1P0000 at 7867w2d admitted for induction of labor due to A2DM.  Subjective: Uncomfortable w/ uc's, wants water broken  Objective: BP (!) 128/92   Pulse 61   Temp 98.2 F (36.8 C) (Oral)   Resp 18   Ht 4\' 11"  (1.499 m)   Wt 72.6 kg (160 lb)   LMP 05/23/2015   BMI 32.32 kg/m  No intake/output data recorded.  FHT:  FHR: 145 bpm, variability: moderate,  accelerations:  Present,  decelerations:  Absent UC:   regular, every 2-3 minutes  SVE:   Dilation: 7 Effacement (%): 80 Station: 0 Exam by:: kim Haruki Arnold,CNM  AROM small amt MSF  Pitocin @ 6 mu/min  Labs: Lab Results  Component Value Date   WBC 10.5 02/21/2016   HGB 12.6 02/21/2016   HCT 36.8 02/21/2016   MCV 87.4 02/21/2016   PLT 273 02/21/2016    Assessment / Plan: IOL d/t A2DM, on pit, now arom'd, cbg's stable  Labor: Progressing normally Fetal Wellbeing:  Category I Pain Control:  IV pain meds Pre-eclampsia: n/a I/D:  n/a Anticipated MOD:  NSVD  Marge DuncansBooker, Alice Velez CNM, WHNP-BC 02/22/2016, 6:39 PM

## 2016-02-22 NOTE — Anesthesia Preprocedure Evaluation (Signed)
Anesthesia Evaluation  Patient identified by MRN, date of birth, ID band Patient awake    Reviewed: Allergy & Precautions, H&P , Patient's Chart, lab work & pertinent test results  Airway Mallampati: II TM Distance: >3 FB Neck ROM: full    Dental  (+) Teeth Intact   Pulmonary  breath sounds clear to auscultation        Cardiovascular Rhythm:regular Rate:Normal     Neuro/Psych    GI/Hepatic   Endo/Other  diabetes, Gestational  Renal/GU      Musculoskeletal   Abdominal   Peds  Hematology   Anesthesia Other Findings       Reproductive/Obstetrics (+) Pregnancy                           Anesthesia Physical Anesthesia Plan  ASA: II  Anesthesia Plan: Epidural   Post-op Pain Management:    Induction:   Airway Management Planned:   Additional Equipment:   Intra-op Plan:   Post-operative Plan:   Informed Consent: I have reviewed the patients History and Physical, chart, labs and discussed the procedure including the risks, benefits and alternatives for the proposed anesthesia with the patient or authorized representative who has indicated his/her understanding and acceptance.   Dental Advisory Given  Plan Discussed with:   Anesthesia Plan Comments: (Labs checked- platelets confirmed with RN in room. Fetal heart tracing, per RN, reported to be stable enough for sitting procedure. Discussed epidural, and patient consents to the procedure:  included risk of possible headache,backache, failed block, allergic reaction, and nerve injury. This patient was asked if she had any questions or concerns before the procedure started.)        Anesthesia Quick Evaluation  

## 2016-02-23 ENCOUNTER — Encounter (HOSPITAL_COMMUNITY): Payer: Self-pay

## 2016-02-23 ENCOUNTER — Encounter (HOSPITAL_COMMUNITY)
Admission: RE | Disposition: A | Discharge status: Home / Self Care | Payer: Self-pay | Source: Ambulatory Visit | Attending: Family Medicine

## 2016-02-23 DIAGNOSIS — O24429 Gestational diabetes mellitus in childbirth, unspecified control: Secondary | ICD-10-CM

## 2016-02-23 DIAGNOSIS — Z3A39 39 weeks gestation of pregnancy: Secondary | ICD-10-CM

## 2016-02-23 LAB — CBC
HEMATOCRIT: 38.2 % (ref 36.0–46.0)
Hemoglobin: 13 g/dL (ref 12.0–15.0)
MCH: 29.6 pg (ref 26.0–34.0)
MCHC: 34 g/dL (ref 30.0–36.0)
MCV: 87 fL (ref 78.0–100.0)
Platelets: 281 10*3/uL (ref 150–400)
RBC: 4.39 MIL/uL (ref 3.87–5.11)
RDW: 14.5 % (ref 11.5–15.5)
WBC: 26 10*3/uL — ABNORMAL HIGH (ref 4.0–10.5)

## 2016-02-23 LAB — COMPREHENSIVE METABOLIC PANEL
ALBUMIN: 2.7 g/dL — AB (ref 3.5–5.0)
ALT: 24 U/L (ref 14–54)
ANION GAP: 10 (ref 5–15)
AST: 29 U/L (ref 15–41)
Alkaline Phosphatase: 154 U/L — ABNORMAL HIGH (ref 38–126)
BILIRUBIN TOTAL: 0.7 mg/dL (ref 0.3–1.2)
BUN: 14 mg/dL (ref 6–20)
CALCIUM: 8.7 mg/dL — AB (ref 8.9–10.3)
CO2: 20 mmol/L — ABNORMAL LOW (ref 22–32)
Chloride: 104 mmol/L (ref 101–111)
Creatinine, Ser: 0.75 mg/dL (ref 0.44–1.00)
Glucose, Bld: 156 mg/dL — ABNORMAL HIGH (ref 65–99)
POTASSIUM: 4.3 mmol/L (ref 3.5–5.1)
Sodium: 134 mmol/L — ABNORMAL LOW (ref 135–145)
TOTAL PROTEIN: 6.2 g/dL — AB (ref 6.5–8.1)

## 2016-02-23 LAB — GLUCOSE, CAPILLARY
GLUCOSE-CAPILLARY: 156 mg/dL — AB (ref 65–99)
GLUCOSE-CAPILLARY: 137 mg/dL — AB (ref 65–99)
GLUCOSE-CAPILLARY: 142 mg/dL — AB (ref 65–99)
GLUCOSE-CAPILLARY: 128 mg/dL — AB (ref 65–99)
GLUCOSE-CAPILLARY: 120 mg/dL — AB (ref 65–99)
GLUCOSE-CAPILLARY: 172 mg/dL — AB (ref 65–99)
Glucose-Capillary: 173 mg/dL — ABNORMAL HIGH (ref 65–99)

## 2016-02-23 LAB — PROTEIN / CREATININE RATIO, URINE
CREATININE, URINE: 104 mg/dL
PROTEIN CREATININE RATIO: 5.37 mg/mg{creat} — AB (ref 0.00–0.15)
TOTAL PROTEIN, URINE: 558 mg/dL

## 2016-02-23 SURGERY — Surgical Case
Anesthesia: Epidural

## 2016-02-23 MED ORDER — WITCH HAZEL-GLYCERIN EX PADS: 1.0000 "application " | MEDICATED_PAD | CUTANEOUS | Status: DC | PRN

## 2016-02-23 MED ORDER — GENTAMICIN SULFATE 40 MG/ML IJ SOLN
130.0000 mg | Freq: Three times a day (TID) | INTRAVENOUS | Status: DC
  Administered 2016-02-23: 130 mg via INTRAVENOUS
  Filled 2016-02-23: qty 3.25

## 2016-02-23 MED ORDER — OXYTOCIN 10 UNIT/ML IJ SOLN
INTRAMUSCULAR | Status: AC
  Filled 2016-02-23: qty 4

## 2016-02-23 MED ORDER — OXYCODONE HCL 5 MG PO TABS
5.0000 mg | ORAL_TABLET | ORAL | Status: DC | PRN
  Administered 2016-02-24 – 2016-02-26 (×6): 5 mg via ORAL
  Filled 2016-02-23 (×7): qty 1

## 2016-02-23 MED ORDER — ACETAMINOPHEN 500 MG PO TABS
1000.0000 mg | ORAL_TABLET | Freq: Once | ORAL | Status: AC
  Administered 2016-02-23: 1000 mg via ORAL
  Filled 2016-02-23: qty 2

## 2016-02-23 MED ORDER — NALBUPHINE HCL 10 MG/ML IJ SOLN: 5.0000 mg | Freq: Once | INTRAMUSCULAR | Status: DC | PRN

## 2016-02-23 MED ORDER — NALBUPHINE HCL 10 MG/ML IJ SOLN
5.0000 mg | INTRAMUSCULAR | Status: DC | PRN
Start: 2016-02-23 — End: 2016-02-26

## 2016-02-23 MED ORDER — DEXAMETHASONE SODIUM PHOSPHATE 4 MG/ML IJ SOLN
INTRAMUSCULAR | Status: AC
  Filled 2016-02-23: qty 1

## 2016-02-23 MED ORDER — LACTATED RINGERS IV SOLN: INTRAVENOUS | Status: DC

## 2016-02-23 MED ORDER — CARBOPROST TROMETHAMINE 250 MCG/ML IM SOLN
INTRAMUSCULAR | Status: AC
  Filled 2016-02-23: qty 1

## 2016-02-23 MED ORDER — NALOXONE HCL 2 MG/2ML IJ SOSY
1.0000 ug/kg/h | PREFILLED_SYRINGE | INTRAVENOUS | Status: DC | PRN
  Filled 2016-02-23: qty 2

## 2016-02-23 MED ORDER — METFORMIN HCL 500 MG PO TABS
1000.0000 mg | ORAL_TABLET | Freq: Two times a day (BID) | ORAL | Status: DC
Start: 2016-02-23 — End: 2016-02-26
  Administered 2016-02-23 – 2016-02-25 (×4): 1000 mg via ORAL
  Filled 2016-02-23 (×9): qty 2

## 2016-02-23 MED ORDER — DEXAMETHASONE SODIUM PHOSPHATE 4 MG/ML IJ SOLN
INTRAMUSCULAR | Status: DC | PRN
  Administered 2016-02-23: 4 mg via INTRAVENOUS

## 2016-02-23 MED ORDER — FENTANYL CITRATE (PF) 100 MCG/2ML IJ SOLN
INTRAMUSCULAR | Status: AC
  Filled 2016-02-23: qty 2

## 2016-02-23 MED ORDER — SODIUM CHLORIDE 0.9% FLUSH: 3.0000 mL | INTRAVENOUS | Status: DC | PRN

## 2016-02-23 MED ORDER — DIPHENHYDRAMINE HCL 50 MG/ML IJ SOLN: 12.5000 mg | INTRAMUSCULAR | Status: DC | PRN

## 2016-02-23 MED ORDER — MENTHOL 3 MG MT LOZG: 1.0000 | LOZENGE | OROMUCOSAL | Status: DC | PRN

## 2016-02-23 MED ORDER — LACTATED RINGERS IV SOLN
INTRAVENOUS | Status: DC | PRN
  Administered 2016-02-23: 07:00:00 via INTRAVENOUS

## 2016-02-23 MED ORDER — SIMETHICONE 80 MG PO CHEW
80.0000 mg | CHEWABLE_TABLET | ORAL | Status: DC | PRN
  Filled 2016-02-23: qty 1

## 2016-02-23 MED ORDER — ONDANSETRON HCL 4 MG/2ML IJ SOLN
INTRAMUSCULAR | Status: AC
  Filled 2016-02-23: qty 2

## 2016-02-23 MED ORDER — NALBUPHINE HCL 10 MG/ML IJ SOLN: 5.0000 mg | INTRAMUSCULAR | Status: DC | PRN

## 2016-02-23 MED ORDER — ACETAMINOPHEN 325 MG PO TABS
650.0000 mg | ORAL_TABLET | ORAL | Status: DC | PRN
  Administered 2016-02-23 – 2016-02-26 (×6): 650 mg via ORAL
  Filled 2016-02-23 (×6): qty 2

## 2016-02-23 MED ORDER — SODIUM CHLORIDE 0.9 % IV SOLN: INTRAVENOUS | Status: DC

## 2016-02-23 MED ORDER — OXYCODONE HCL 5 MG PO TABS
10.0000 mg | ORAL_TABLET | ORAL | Status: DC | PRN
  Administered 2016-02-24 – 2016-02-25 (×5): 10 mg via ORAL
  Filled 2016-02-23 (×5): qty 2

## 2016-02-23 MED ORDER — DEXTROSE-NACL 5-0.45 % IV SOLN: INTRAVENOUS | Status: DC

## 2016-02-23 MED ORDER — MORPHINE SULFATE (PF) 0.5 MG/ML IJ SOLN
INTRAMUSCULAR | Status: DC | PRN
  Administered 2016-02-23: 4 mg via EPIDURAL
  Administered 2016-02-23: 1 mg via INTRAVENOUS

## 2016-02-23 MED ORDER — ZOLPIDEM TARTRATE 5 MG PO TABS: 5.0000 mg | ORAL_TABLET | Freq: Every evening | ORAL | Status: DC | PRN

## 2016-02-23 MED ORDER — PRENATAL MULTIVITAMIN CH
1.0000 | ORAL_TABLET | Freq: Every day | ORAL | Status: DC
  Administered 2016-02-24 – 2016-02-25 (×2): 1 via ORAL
  Filled 2016-02-23 (×2): qty 1

## 2016-02-23 MED ORDER — KETOROLAC TROMETHAMINE 30 MG/ML IJ SOLN
30.0000 mg | Freq: Four times a day (QID) | INTRAMUSCULAR | Status: AC | PRN
  Administered 2016-02-23: 30 mg via INTRAMUSCULAR

## 2016-02-23 MED ORDER — SCOPOLAMINE 1 MG/3DAYS TD PT72
MEDICATED_PATCH | TRANSDERMAL | Status: DC | PRN
  Administered 2016-02-23: 1 via TRANSDERMAL

## 2016-02-23 MED ORDER — ALBUMIN HUMAN 5 % IV SOLN
INTRAVENOUS | Status: AC
  Filled 2016-02-23: qty 250

## 2016-02-23 MED ORDER — ONDANSETRON HCL 4 MG/2ML IJ SOLN
INTRAMUSCULAR | Status: DC | PRN
  Administered 2016-02-23: 4 mg via INTRAVENOUS

## 2016-02-23 MED ORDER — SCOPOLAMINE 1 MG/3DAYS TD PT72
1.0000 | MEDICATED_PATCH | Freq: Once | TRANSDERMAL | Status: DC
  Filled 2016-02-23: qty 1

## 2016-02-23 MED ORDER — OXYTOCIN 40 UNITS IN LACTATED RINGERS INFUSION - SIMPLE MED: 2.5000 [IU]/h | INTRAVENOUS | Status: AC

## 2016-02-23 MED ORDER — INSULIN ASPART 100 UNIT/ML ~~LOC~~ SOLN: 0.0000 [IU] | SUBCUTANEOUS | Status: DC

## 2016-02-23 MED ORDER — ONDANSETRON HCL 4 MG/2ML IJ SOLN: 4.0000 mg | Freq: Three times a day (TID) | INTRAMUSCULAR | Status: DC | PRN

## 2016-02-23 MED ORDER — SODIUM CHLORIDE 0.9 % IV SOLN
2.0000 g | Freq: Four times a day (QID) | INTRAVENOUS | Status: DC
  Administered 2016-02-23: 2 g via INTRAVENOUS
  Filled 2016-02-23 (×2): qty 2000

## 2016-02-23 MED ORDER — SIMETHICONE 80 MG PO CHEW
80.0000 mg | CHEWABLE_TABLET | ORAL | Status: DC
  Administered 2016-02-23 – 2016-02-26 (×3): 80 mg via ORAL
  Filled 2016-02-23 (×4): qty 1

## 2016-02-23 MED ORDER — SCOPOLAMINE 1 MG/3DAYS TD PT72
MEDICATED_PATCH | TRANSDERMAL | Status: AC
  Filled 2016-02-23: qty 1

## 2016-02-23 MED ORDER — TETANUS-DIPHTH-ACELL PERTUSSIS 5-2.5-18.5 LF-MCG/0.5 IM SUSP: 0.5000 mL | Freq: Once | INTRAMUSCULAR | Status: DC

## 2016-02-23 MED ORDER — KETOROLAC TROMETHAMINE 30 MG/ML IJ SOLN
INTRAMUSCULAR | Status: AC
  Filled 2016-02-23: qty 1

## 2016-02-23 MED ORDER — DIPHENHYDRAMINE HCL 25 MG PO CAPS: 25.0000 mg | ORAL_CAPSULE | ORAL | Status: DC | PRN

## 2016-02-23 MED ORDER — DEXTROSE 5 % IV SOLN
1.0000 g | Freq: Two times a day (BID) | INTRAVENOUS | Status: AC
  Administered 2016-02-23 (×2): 1 g via INTRAVENOUS
  Filled 2016-02-23 (×2): qty 1

## 2016-02-23 MED ORDER — DIBUCAINE 1 % RE OINT: 1.0000 "application " | TOPICAL_OINTMENT | RECTAL | Status: DC | PRN

## 2016-02-23 MED ORDER — SIMETHICONE 80 MG PO CHEW
80.0000 mg | CHEWABLE_TABLET | Freq: Three times a day (TID) | ORAL | Status: DC
  Administered 2016-02-23 – 2016-02-26 (×8): 80 mg via ORAL
  Filled 2016-02-23 (×7): qty 1

## 2016-02-23 MED ORDER — MEPERIDINE HCL 25 MG/ML IJ SOLN: 6.2500 mg | INTRAMUSCULAR | Status: DC | PRN

## 2016-02-23 MED ORDER — METHYLERGONOVINE MALEATE 0.2 MG/ML IJ SOLN
INTRAMUSCULAR | Status: AC
  Filled 2016-02-23: qty 1

## 2016-02-23 MED ORDER — OXYTOCIN 10 UNIT/ML IJ SOLN
INTRAMUSCULAR | Status: DC | PRN
  Administered 2016-02-23: 40 [IU] via INTRAVENOUS

## 2016-02-23 MED ORDER — FENTANYL CITRATE (PF) 100 MCG/2ML IJ SOLN
INTRAMUSCULAR | Status: DC | PRN
  Administered 2016-02-23: 50 ug via INTRAVENOUS
  Administered 2016-02-23: 100 ug via INTRAVENOUS
  Administered 2016-02-23: 50 ug via INTRAVENOUS

## 2016-02-23 MED ORDER — IBUPROFEN 600 MG PO TABS
600.0000 mg | ORAL_TABLET | Freq: Four times a day (QID) | ORAL | Status: DC
  Administered 2016-02-23 – 2016-02-26 (×11): 600 mg via ORAL
  Filled 2016-02-23 (×12): qty 1

## 2016-02-23 MED ORDER — COCONUT OIL OIL
1.0000 | TOPICAL_OIL | Status: DC | PRN
Start: 2016-02-23 — End: 2016-02-26

## 2016-02-23 MED ORDER — AZITHROMYCIN 500 MG PO TABS
1000.0000 mg | ORAL_TABLET | Freq: Once | ORAL | Status: AC
  Administered 2016-02-23: 1000 mg via ORAL
  Filled 2016-02-23: qty 2

## 2016-02-23 MED ORDER — KETOROLAC TROMETHAMINE 30 MG/ML IJ SOLN: 30.0000 mg | Freq: Four times a day (QID) | INTRAMUSCULAR | Status: AC | PRN

## 2016-02-23 MED ORDER — SODIUM BICARBONATE 8.4 % IV SOLN
INTRAVENOUS | Status: DC | PRN
  Administered 2016-02-23: 5 mL via EPIDURAL
  Administered 2016-02-23: 8 mL via EPIDURAL
  Administered 2016-02-23: 5 mL via EPIDURAL

## 2016-02-23 MED ORDER — MISOPROSTOL 200 MCG PO TABS
ORAL_TABLET | ORAL | Status: AC
  Filled 2016-02-23: qty 1

## 2016-02-23 MED ORDER — SODIUM CHLORIDE 0.9 % IV SOLN
INTRAVENOUS | Status: DC
  Administered 2016-02-23: 1 [IU]/h via INTRAVENOUS
  Administered 2016-02-23: 2.7 [IU]/h via INTRAVENOUS
  Filled 2016-02-23: qty 2.5

## 2016-02-23 MED ORDER — MORPHINE SULFATE (PF) 0.5 MG/ML IJ SOLN
INTRAMUSCULAR | Status: AC
  Filled 2016-02-23: qty 10

## 2016-02-23 MED ORDER — SENNOSIDES-DOCUSATE SODIUM 8.6-50 MG PO TABS
2.0000 | ORAL_TABLET | ORAL | Status: DC
  Administered 2016-02-23 – 2016-02-26 (×3): 2 via ORAL
  Filled 2016-02-23 (×4): qty 2

## 2016-02-23 MED ORDER — NALOXONE HCL 0.4 MG/ML IJ SOLN: 0.4000 mg | INTRAMUSCULAR | Status: DC | PRN

## 2016-02-23 MED ORDER — DIPHENHYDRAMINE HCL 25 MG PO CAPS: 25.0000 mg | ORAL_CAPSULE | Freq: Four times a day (QID) | ORAL | Status: DC | PRN

## 2016-02-23 MED ORDER — AZITHROMYCIN 500 MG IV SOLR: 500.0000 mg | INTRAVENOUS | Status: DC

## 2016-02-23 SURGICAL SUPPLY — 32 items
BENZOIN TINCTURE PRP APPL 2/3 (GAUZE/BANDAGES/DRESSINGS) ×3 IMPLANT
CHLORAPREP W/TINT 26ML (MISCELLANEOUS) ×3 IMPLANT
CLAMP CORD UMBIL (MISCELLANEOUS) IMPLANT
CLOSURE WOUND 1/2 X4 (GAUZE/BANDAGES/DRESSINGS) ×1
CLOTH BEACON ORANGE TIMEOUT ST (SAFETY) ×3 IMPLANT
DRSG OPSITE POSTOP 4X10 (GAUZE/BANDAGES/DRESSINGS) ×3 IMPLANT
ELECT REM PT RETURN 9FT ADLT (ELECTROSURGICAL) ×3
ELECTRODE REM PT RTRN 9FT ADLT (ELECTROSURGICAL) ×1 IMPLANT
EXTRACTOR VACUUM M CUP 4 TUBE (SUCTIONS) IMPLANT
EXTRACTOR VACUUM M CUP 4' TUBE (SUCTIONS)
GLOVE BIOGEL PI IND STRL 7.0 (GLOVE) ×2 IMPLANT
GLOVE BIOGEL PI INDICATOR 7.0 (GLOVE) ×4
GLOVE ECLIPSE 7.0 STRL STRAW (GLOVE) ×6 IMPLANT
GOWN STRL REUS W/TWL LRG LVL3 (GOWN DISPOSABLE) ×6 IMPLANT
KIT ABG SYR 3ML LUER SLIP (SYRINGE) IMPLANT
NEEDLE HYPO 22GX1.5 SAFETY (NEEDLE) ×3 IMPLANT
NEEDLE HYPO 25X5/8 SAFETYGLIDE (NEEDLE) IMPLANT
NS IRRIG 1000ML POUR BTL (IV SOLUTION) ×3 IMPLANT
PACK C SECTION WH (CUSTOM PROCEDURE TRAY) ×3 IMPLANT
PAD ABD 8X7 1/2 STERILE (GAUZE/BANDAGES/DRESSINGS) ×3 IMPLANT
PAD OB MATERNITY 4.3X12.25 (PERSONAL CARE ITEMS) ×3 IMPLANT
PENCIL SMOKE EVAC W/HOLSTER (ELECTROSURGICAL) ×3 IMPLANT
RTRCTR C-SECT PINK 25CM LRG (MISCELLANEOUS) ×3 IMPLANT
SPONGE GAUZE 4X4 12PLY (GAUZE/BANDAGES/DRESSINGS) ×3 IMPLANT
STRIP CLOSURE SKIN 1/2X4 (GAUZE/BANDAGES/DRESSINGS) ×2 IMPLANT
SUT PLAIN 2 0 XLH (SUTURE) ×3 IMPLANT
SUT VIC AB 0 CTX 36 (SUTURE) ×6
SUT VIC AB 0 CTX36XBRD ANBCTRL (SUTURE) ×3 IMPLANT
SUT VIC AB 4-0 KS 27 (SUTURE) ×3 IMPLANT
SYR 30ML LL (SYRINGE) ×3 IMPLANT
TOWEL OR 17X24 6PK STRL BLUE (TOWEL DISPOSABLE) ×3 IMPLANT
TRAY FOLEY CATH SILVER 14FR (SET/KITS/TRAYS/PACK) ×3 IMPLANT

## 2016-02-23 NOTE — Progress Notes (Signed)
Pharmacy Antibiotic Note  Alice BroachRosa Romero Velez is a 20 y.o. female admitted on 02/21/2016 with Triple I.  Pharmacy has been consulted for gentamicin dosing.  Plan: Gentamicin 130 mg IV Q8h  Height: 4\' 11"  (149.9 cm) Weight: 160 lb (72.6 kg) IBW/kg (Calculated) : 43.2  Temp (24hrs), Avg:98.9 F (37.2 C), Min:98 F (36.7 C), Max:101 F (38.3 C)   Recent Labs Lab 02/17/16 1454 02/21/16 0840 02/23/16 0025  WBC 10.4 10.5 26.0*  CREATININE 0.57  --  0.75    Estimated Creatinine Clearance: 95.8 mL/min (by C-G formula based on SCr of 0.75 mg/dL).    No Known Allergies  Antimicrobials this admission: 10/1 ampicillin >>  10/1 azithromycin 1g PO x 1   Dose adjustments this admission: none  Microbiology results: none  Thank you for allowing pharmacy to be a part of this patient's care.  Alice Velez, Alice Velez 02/23/2016 6:39 AM

## 2016-02-23 NOTE — Transfer of Care (Signed)
Immediate Anesthesia Transfer of Care Note  Patient: Alice BroachRosa Romero Velez  Procedure(s) Performed: Procedure(s): CESAREAN SECTION (N/A)  Patient Location: PACU  Anesthesia Type:Epidural  Level of Consciousness: awake, alert  and oriented  Airway & Oxygen Therapy: Patient Spontanous Breathing  Post-op Assessment: Report given to RN and Post -op Vital signs reviewed and stable  Post vital signs: Reviewed and stable  Last Vitals:  Vitals:   02/23/16 0636 02/23/16 0637  BP: 139/72 136/69  Pulse: (!) 132 (!) 127  Resp:    Temp:      Last Pain:  Vitals:   02/23/16 0556  TempSrc: Axillary  PainSc:       Patients Stated Pain Goal:  (unable to assess; pt speaks Spanish) (02/21/16 [id number])  Complications: No apparent anesthesia complications

## 2016-02-23 NOTE — Progress Notes (Signed)
CRITICAL VALUE ALERT  Critical value received:  CBG  Date of notification:  02/23/16  Time of notification:  0832  Critical value read back:Yes.    Nurse who received alert:  s Kitti Mcclish rn  MD notified (1st page):  Genella RifeK Booker CNM  Time of first page:    MD notified (2nd page):  Time of second page:  Responding MD:    Time MD responded:

## 2016-02-23 NOTE — Lactation Note (Signed)
This note was copied from a baby's chart. Lactation Consultation Note Initial visit at 13 hours of age, with Spanish interpreter, [name].  Mom reports minimal changes in breasts during pregnancy.  Mom noted to have wide spaced breasts with cone shape.  Mom has extra breast tissue in axillary area bilaterally, new during pregnancy. Mom has easily expressed colostrum and applied to baby's mouth.  Lc assisted with STS on right breast in football hold.  Baby slipped off nipple some and with assist was then able to get a deep latch and maintained with stimulation for about 10 minutes and then released nipple and fell asleep on mom.  Mom feels encouraged with baby eating at breast. LC encouraged mom to offer breast on demand and baby does not need formula at this time.   Mom is recovering from c/s and is sleepy.  Mom will need assist with latching during the night and was encourage to call RN as needed.   [facility] LC resources given and discussed.  Encouraged to feed with early cues on demand.  Early newborn behavior discussed.  Fob at bedside supportive.   Patient Name: Alice Velez AVWUJ'WToday's Date: 02/23/2016 Reason for consult: Initial assessment   Maternal Data Has patient been taught Hand Expression?: Yes Does the patient have breastfeeding experience prior to this delivery?: No  Feeding Feeding Type: Breast Fed Nipple Type: Slow - flow Length of feed: 10 min  LATCH Score/Interventions Latch: Repeated attempts needed to sustain latch, nipple held in mouth throughout feeding, stimulation needed to elicit sucking reflex. Intervention(s): Waking techniques;Teach feeding cues;Skin to skin Intervention(s): Adjust position;Assist with latch;Breast massage;Breast compression  Audible Swallowing: A few with stimulation Intervention(s): Skin to skin;Hand expression;Alternate breast massage  Type of Nipple: Everted at rest and after stimulation  Comfort (Breast/Nipple): Soft / non-tender      Hold (Positioning): Assistance needed to correctly position infant at breast and maintain latch. Intervention(s): Breastfeeding basics reviewed;Support Pillows;Position options;Skin to skin  LATCH Score: 7  Lactation Tools Discussed/Used WIC Program: Yes   Consult Status Consult Status: Follow-up Date: 02/24/16 Follow-up type: In-patient    Jannifer RodneyShoptaw, Hashir Deleeuw Lynn 02/23/2016, 8:49 PM

## 2016-02-23 NOTE — Progress Notes (Addendum)
Patient ID: Don BroachRosa Romero Vasquez, female   DOB: Feb 17, 1996, 20 y.o.   MRN: [id number] Patient was 7 cm at 8 pm and has been 8 cm since 11, with no change in exam. Patient with fairly large fetus and no change. Contraction pattern is not great but has been adequate x 4 hours. Now with fever, chills, elevated WBC and fetal tachycardia. Will give Amp/Gent/Azithro and Tylenol. Will proceed with abdominal delivery. Risks include but are not limited to bleeding, infection, injury to surrounding structures, including bowel, bladder and ureters, blood clots, and death.  Likelihood of success is high.

## 2016-02-23 NOTE — Addendum Note (Signed)
Addendum  created 02/23/16 2006 by Elbert Ewingsolleen S Danni Leabo, CRNA   Sign clinical note

## 2016-02-23 NOTE — Anesthesia Postprocedure Evaluation (Signed)
Anesthesia Post Note  Patient: Alice BroachRosa Romero Velez  Procedure(s) Performed: Procedure(s) (LRB): CESAREAN SECTION (N/A)  Patient location during evaluation: PACU Anesthesia Type: Epidural Level of consciousness: awake and alert Pain management: pain level controlled Vital Signs Assessment: post-procedure vital signs reviewed and stable Respiratory status: spontaneous breathing, nonlabored ventilation and respiratory function stable Cardiovascular status: stable Postop Assessment: no headache, no backache and epidural receding Anesthetic complications: no     Last Vitals:  Vitals:   02/23/16 1145 02/23/16 1245  BP: 111/64 115/72  Pulse: 91 95  Resp: 20 18  Temp: 36.7 C 37 C    Last Pain:  Vitals:   02/23/16 1245  TempSrc: Oral  PainSc: 3    Pain Goal: Patients Stated Pain Goal:  (unable to assess; pt speaks Spanish) (02/21/16 0921)               Linton RumpJennifer Dickerson Jansen Sciuto

## 2016-02-23 NOTE — Progress Notes (Signed)
Patient ID: Alice BroachRosa Romero Velez, female   DOB: [birth year], 20 y.o.   MRN: [id number] Alice BroachRosa Romero Velez is a 20 y.o. G1P0000 at [redacted]w[redacted]d admitted for induction of labor due to A2DM.  Subjective: Comfortable, no complaints  Objective: BP (!) 160/81 (BP Location: Right Leg)   Pulse (!) 113   Temp (!) 101 F (38.3 C) (Axillary)   Resp 20   Ht 4\' 11"  (1.499 m)   Wt 72.6 kg (160 lb)   LMP 05/23/2015   SpO2 99%   BMI 32.32 kg/m  No intake/output data recorded.  FHT:  FHR: 175 bpm, variability: moderate,  accelerations:  Abscent,  decelerations:  Present earlies UC:   regular, every 2-3 minutes Adequate mvu's  SVE:   Dilation: 8 Effacement (%): 90 Station: 0 Exam by:: Genella RifeK. Booker, CNM  Pitocin @ 18 mu/min  Labs: Lab Results  Component Value Date   WBC 26.0 (H) 02/23/2016   HGB 13.0 02/23/2016   HCT 38.2 02/23/2016   MCV 87.0 02/23/2016   PLT 281 02/23/2016    Assessment / Plan: IOL d/t A2DM, no cervical change, LGA w/ A2DM- has required glucostabilizer to be started over last few hours, now w/ Triple I  Notified Dr. Shawnie PonsPratt of all, will come discuss c/s w/ pt  Labor: arrest of dilation Fetal Wellbeing:  Category II Pain Control:  Epidural Pre-eclampsia: labile bp's, p:c ratio 5.37 I/D:  Amp/gent, azithromycin 1gm po, tylenol 1gm po per Dr. Shawnie PonsPratt Anticipated MOD:  c/s  Marge DuncansBooker, Kimberly Randall CNM, WHNP-BC 02/23/2016, 6:07 AM

## 2016-02-23 NOTE — Op Note (Signed)
Cesarean Section Operative Report  Alice BroachRosa Romero Velez  02/21/2016 - 02/23/2016  Indications: failure to progress   Pre-operative Diagnosis: Failure to Progress  Post-operative Diagnosis: Same   Surgeon: Surgeon(s) and Role:    * Reva Boresanya S Pratt, MD - Primary        Ernestina PennaNicholas Schenk MD - Fellow  Assistants: none  Anesthesia: epidural    Estimated Blood Loss: 600 ml  Total IV Fluids: 1300 ml LR  Urine Output:: 250 ml clear urine  Specimens: placenta to pathology  Antibiotics: pre-operatively amipcillin/gentamycin/azithromycin post-operatively 24hrs of cefotan.  Findings: Viable female infant in cephalic presentation; Apgars pending; arterial; thick meconium and purulent amniotic fluid; intact placenta with three vessel cord; normal uterus, fallopian tubes and ovaries bilaterally.  Baby condition / location:  Couplet care / Skin to Skin   Complications: no complications  Indications: Alice BroachRosa Romero Velez is a 20 y.o. G1P1001 with an IUP 7052w3d presenting for IOL for A2B diabetes melitus.  Procedure Details:  The patient was taken back to the operative suite where epidural anesthesia was bolused  A time out was held and the above information confirmed.   After induction of anesthesia, the patient was draped and prepped in the usual sterile manner and placed in a dorsal supine position with a leftward tilt. A Pfannenstiel incision was made and carried down through the subcutaneous tissue to the fascia. Fascial incision was made and charply extended transversely. The fascia was separated from the underlying rectus tissue superiorly and inferiorly. The peritoneum was identified and bluntly entered and extended longitudinally. Alexis retractor was placed. A low transverse uterine incision was made and extended bluntly. Delivered from cephalic presentation was a viable infant with Apgars and weight as above.  After waiting 60 seconds for delayed cord cutting, the umbilical cord was  clamped and cut cord blood was obtained for evaluation. Cord ph was not sent. The placenta was removed Intact and appeared normal. With removal of the placenta the uterus was inverted, but easly return to anatomic position. The uterine outline, tubes and ovaries appeared normal. The uterine incision was closed with running locked sutures of 0Vicryl with an imbricating layer of the same.   Hemostasis was observed. The peritoneum was closed with 0 vicryl. The rectus muscles were examined and hemostasis observed. The fascia was then reapproximated with running sutures of 0Vicryl.  The subcuticular closure was performed using 0plain gut. The skin was closed with 4-0Vicryl.   Instrument, sponge, and needle counts were correct prior the abdominal closure and were correct at the conclusion of the case.     Disposition: PACU - hemodynamically stable.       SignedLes Pou: Nicholas SchenkMD 02/23/2016 7:52 AM

## 2016-02-23 NOTE — Anesthesia Postprocedure Evaluation (Signed)
Anesthesia Post Note  Patient: Alice BroachRosa Romero Velez  Procedure(s) Performed: Procedure(s) (LRB): CESAREAN SECTION (N/A)  Patient location during evaluation: Mother Baby Anesthesia Type: Epidural Level of consciousness: awake, awake and alert and oriented Pain management: pain level controlled Vital Signs Assessment: post-procedure vital signs reviewed and stable Respiratory status: spontaneous breathing, nonlabored ventilation and respiratory function stable Cardiovascular status: stable Postop Assessment: no headache, no backache, patient able to bend at knees, no signs of nausea or vomiting and adequate PO intake Anesthetic complications: no     Last Vitals:  Vitals:   02/23/16 1645 02/23/16 1949  BP: (!) 103/55 (!) 98/56  Pulse: 92 92  Resp: 19 16  Temp: 36.8 C 36.5 C    Last Pain:  Vitals:   02/23/16 1645  TempSrc: Oral  PainSc: 2    Pain Goal: Patients Stated Pain Goal:  (unable to assess; pt speaks Spanish) (02/21/16 [id number])               Truitt LeepHYMER,Nomar Broad

## 2016-02-24 LAB — GLUCOSE, CAPILLARY: GLUCOSE-CAPILLARY: 117 mg/dL — AB (ref 65–99)

## 2016-02-24 LAB — CBC
HCT: 27.3 % — ABNORMAL LOW (ref 36.0–46.0)
Hemoglobin: 9.2 g/dL — ABNORMAL LOW (ref 12.0–15.0)
MCH: 29.6 pg (ref 26.0–34.0)
MCHC: 33.7 g/dL (ref 30.0–36.0)
MCV: 87.8 fL (ref 78.0–100.0)
Platelets: 217 10*3/uL (ref 150–400)
RBC: 3.11 MIL/uL — ABNORMAL LOW (ref 3.87–5.11)
RDW: 14.9 % (ref 11.5–15.5)
WBC: 16.1 10*3/uL — ABNORMAL HIGH (ref 4.0–10.5)

## 2016-02-24 NOTE — Progress Notes (Signed)
Subjective: Postpartum Day 1: Cesarean Delivery Patient reports incisional pain, tolerating PO, + flatus and no problems voiding.    Objective: Vital signs in last 24 hours: Temp:  [97.7 F (36.5 C)-99.9 F (37.7 C)] 98 F (36.7 C) (10/02 0330) Pulse Rate:  [91-132] 92 (10/02 0330) Resp:  [15-20] 20 (10/02 0330) BP: (90-139)/(44-77) 96/55 (10/02 0330) SpO2:  [95 %-100 %] 97 % (10/02 0330)  Physical Exam:  General: alert, cooperative, appears stated age and no distress Lochia: appropriate Uterine Fundus: firm Incision: no significant drainage DVT Evaluation: No evidence of DVT seen on physical exam.   Recent Labs  02/23/16 0025 02/24/16 0510  HGB 13.0 9.2*  HCT 38.2 27.3*    Assessment/Plan: Status post Cesarean section. Doing well postoperatively.  Pt sl distended, bowel sounds x 4 Continue current care.  Alice Velez 02/24/2016, 6:17 AM

## 2016-02-24 NOTE — Lactation Note (Signed)
This note was copied from a baby's chart. Lactation Consultation Note  Eda Interpreter present for Spanish. Baby 30 hours old and stirring in crib.  Suggest feeding on demand w/ feeding cues. Reviewed hand expression w/ mother.  Drops expressed. Assisted w/ latching baby in football hold on R side. Baby latched easily.  Sucks and swallows observed w/ breast compression. Encouraged her to compress/massage breast during feeding to keep baby active. Reviewed bf on both breasts per feeding. Encouraged mother to bf before offering formula to help establish her milk supply. Mother states she has no questions for IBCLC at this time.   Patient Name: Alice [name] ZOXWR'UToday's Date: 02/24/2016 Reason for consult: Follow-up assessment   Maternal Data    Feeding Feeding Type: Breast Fed Length of feed: 15 min  LATCH Score/Interventions Latch: Grasps breast easily, tongue down, lips flanged, rhythmical sucking. Intervention(s): Teach feeding cues;Waking techniques Intervention(s): Adjust position;Assist with latch;Breast massage;Breast compression  Audible Swallowing: A few with stimulation Intervention(s): Hand expression  Type of Nipple: Everted at rest and after stimulation  Comfort (Breast/Nipple): Soft / non-tender     Hold (Positioning): Assistance needed to correctly position infant at breast and maintain latch. Intervention(s): Support Pillows;Breastfeeding basics reviewed  LATCH Score: 8  Lactation Tools Discussed/Used     Consult Status Consult Status: Follow-up Date: 02/25/16 Follow-up type: In-patient    Dahlia ByesBerkelhammer, Deen Deguia [facility] 02/24/2016, 2:04 PM

## 2016-02-24 NOTE — Plan of Care (Signed)
Problem: Activity: Goal: Ability to tolerate increased activity will improve Outcome: Completed/Met Date Met: 02/24/16 Pt ambulating continuously around room.  Encouraged ambulation in the hallways.

## 2016-02-25 LAB — GLUCOSE, CAPILLARY: Glucose-Capillary: 89 mg/dL (ref 65–99)

## 2016-02-25 NOTE — Progress Notes (Signed)
Inpatient Diabetes Program Recommendations  AACE/ADA: New Consensus Statement on Inpatient Glycemic Control (2015)  Target Ranges:  Prepandial:   less than 140 mg/dL      Peak postprandial:   less than 180 mg/dL (1-2 hours)      Critically ill patients:  140 - 180 mg/dL   Lab Results  Component Value Date   GLUCAP 117 (H) 02/24/2016   HGBA1C 6.4 (H) 10/14/2015    Review of Glycemic Control  Diabetes history: Gestational DM Outpatient Diabetes medications: NPH insulin 30 units am+12 units hs+ Regular insulin 12 units B+14 units Lunch+18 units Dinner  Inpatient Diabetes Program Recommendations:  Please consider CBGs to determine post delivery glycemic control.  Thank you, Alice FischerJudy E. Rawn Quiroa, RN, MSN, CDE Inpatient Glycemic Control Team Team Pager [phone] (8am-5pm) 02/25/2016 8:32 AM

## 2016-02-25 NOTE — Progress Notes (Signed)
POSTPARTUM PROGRESS NOTE  Post Operative Day #2 Subjective:  Alice BroachRosa Romero Velez is a 20 y.o. G1P1001 1857w3d s/p pLTCS for FTP.  No acute events overnight.  Pt denies problems with ambulating, voiding or po intake. She denies nausea or vomiting.  Pain is moderately controlled.  She has had flatus. She had one very watery stool this morning.  Lochia Minimal.   Objective: Blood pressure 112/62, pulse 78, temperature 98 F (36.7 C), temperature source Oral, resp. rate 18, height 4\' 11"  (1.499 m), weight 72.6 kg (160 lb), last menstrual period 05/23/2015, SpO2 96 %, unknown if currently breastfeeding.  Physical Exam:  General: alert, cooperative and no distress Lochia:normal flow Chest: CTAB Heart: RRR no m/r/g Abdomen: Minimal BS, soft, diffusely tender to palpation, moderately distended Uterine Fundus: firm, below the level of the umbilicus DVT Evaluation: No calf swelling or tenderness Extremities: Moderate edema   Recent Labs  02/23/16 0025 02/24/16 0510  HGB 13.0 9.2*  HCT 38.2 27.3*    Assessment/Plan:  ASSESSMENT: Alice BroachRosa Romero Velez is a 20 y.o. G1P1001 357w3d s/p pLTCS for FTP. Abdomen remains distended, but is soft. Tolerating PO. May have post-op ileus. Advised Pt to walk around today to help with this.  Plan for discharge tomorrow.    LOS: 4 days   Alice SinclairKaty D Velez 02/25/2016, 7:32 AM   OB FELLOW POSTPARTUM PROGRESS NOTE ATTESTATION  I have seen and examined this patient and agree with above documentation in the resident's note. Will give suppository and encourage further ambulation. If abdomen symptoms worsen, will perform XRAY. Tolerating diet currently.  Jen MowElizabeth Yanique Mulvihill, DO 12:02 PM

## 2016-02-25 NOTE — Progress Notes (Signed)
Called to room at 1730.  Family/friends reports pt extremely dizzy and shaky, unable to walk. Pt  sitting on sofa, she is pale and shaky.  Reports that she felt faint after walking across room, and fell on to sofa.  I was able to assist her back to bed, but legs gave way at bedside, she was extremely pale and shaky. Vitals are in Epic, concerning bp 160/77,  HR 102.  2+BLE edema .  [facility] Called MD/ resident on call, they were in delivery and unable to come.  I requested for the OB rapid response RN.  She arrived quickly, rechecked vitals, which were improved, CBG 89, lochia small, reports she has HA R side, abd distended, tender, +BS,  +flatus.  Pt reports feels better by 1800.  She has been instructed NOT to get out of bed without assistance.  MD called back checking on pt, pt's next set of vital WNL, interpretor ordered her evening meal, plan to give pain med p she eats.

## 2016-02-25 NOTE — Plan of Care (Signed)
Problem: Bowel/Gastric: Goal: Gastrointestinal status will improve Encouraged increased ambulation and in the hallways today to help increase gastric mobility. Also encouraged increased fluid intake. Patient complaining of increased gas pains. Patient states she had diarrhea stool yesterday but has yet to pass any gas. MD has plan to write order for suppository or enema and informed patient of plan.

## 2016-02-25 NOTE — Progress Notes (Signed)
I assisted Dr Omer JackMumaw with questions, ordered meals, by Orlan LeavensViria Alvarez Spanish Interpreter

## 2016-02-25 NOTE — Progress Notes (Signed)
Diabetes coordinator called to check on blood sugars; requested to check with MD about continuing blood sugars. [facility] blood [facility]. Orpha BurKaty stated they would discuss it in rounds to make a decision. For now, patient has received her Glucophage and breakfast. Earl Galasborne, Linda HedgesStefanie Champion HeightsHudspeth

## 2016-02-26 LAB — CBC
HEMATOCRIT: 27.2 % — AB (ref 36.0–46.0)
Hemoglobin: 9.3 g/dL — ABNORMAL LOW (ref 12.0–15.0)
MCH: 29.6 pg (ref 26.0–34.0)
MCHC: 34.2 g/dL (ref 30.0–36.0)
MCV: 86.6 fL (ref 78.0–100.0)
PLATELETS: 319 10*3/uL (ref 150–400)
RBC: 3.14 MIL/uL — ABNORMAL LOW (ref 3.87–5.11)
RDW: 14.4 % (ref 11.5–15.5)
WBC: 8.9 10*3/uL (ref 4.0–10.5)

## 2016-02-26 MED ORDER — SENNOSIDES-DOCUSATE SODIUM 8.6-50 MG PO TABS: 2.0000 | ORAL_TABLET | ORAL | 1 refills | Status: DC

## 2016-02-26 MED ORDER — METFORMIN HCL 1000 MG PO TABS: 1000.0000 mg | ORAL_TABLET | Freq: Two times a day (BID) | ORAL | 0 refills | Status: AC

## 2016-02-26 MED ORDER — OXYCODONE-ACETAMINOPHEN 5-325 MG PO TABS: 1.0000 | ORAL_TABLET | Freq: Four times a day (QID) | ORAL | 0 refills | Status: DC | PRN

## 2016-02-26 MED ORDER — FERROUS SULFATE 325 (65 FE) MG PO TABS: 325.0000 mg | ORAL_TABLET | Freq: Every day | ORAL | 3 refills | Status: DC

## 2016-02-26 MED ORDER — IBUPROFEN 600 MG PO TABS: 600.0000 mg | ORAL_TABLET | Freq: Four times a day (QID) | ORAL | 0 refills | Status: DC

## 2016-02-26 NOTE — Lactation Note (Signed)
This note was copied from a baby's chart. Lactation Consultation Note: Eda Spanish Interpreter present for all teaching. Mother has bilateral scabs on her nipples  from infant latching on shallow. Mother has full  Breast. Discussed with mother the need to breastfeed infant, then pump her breast. Mother was given a hand pump with instruction. Discussed engorgement will likely be worse if mother continues to use lots of formula. Advised mother to pump and give back any expressed breast milk.advised mother to massage and ice breast for 15 mins every 3-4 hours. Mother was also ask to put on her bra and wear comfort gels.  Mother is aware of available lactation services.   Patient Name: Alice [name] ZOXWR'UToday's Date: 02/26/2016 Reason for consult: Follow-up assessment   Maternal Data    Feeding    LATCH Score/Interventions                      Lactation Tools Discussed/Used     Consult Status Consult Status: Complete    Michel BickersKendrick, Cosmo Tetreault McCoy 02/26/2016, 11:07 AM

## 2016-02-26 NOTE — Discharge Summary (Signed)
OB Discharge Summary  Patient Name: Alice BroachRosa Romero Velez DOB: 07/25/1995 MRN: [id number]  Date of admission: 02/21/2016 Delivering MD: Reva BoresPRATT, TANYA S   Date of discharge: 02/26/2016  Admitting diagnosis: INDUCTION Intrauterine pregnancy: 3470w3d     Secondary diagnosis:Active Problems:   Gestational diabetes mellitus, class A2  Additional problems: Triple I treated with Amp/Gent/Azithro pre-op and Cefotan x 24 hours post-op. Had a small post-op ileus on POD#1, but she was having bowel movements, flatus, and tolerating PO on the day of discharge. She also had an episode of pre-syncope after getting up too fast on POD#2. We thought this was vaso-vagal in etiology. She was having very minimal vaginal bleeding at the time. Anemia mild and stable. Normal exam the following day. No s/s endometritis day of discharge.    Discharge diagnosis: Term Pregnancy Delivered and GDM A2                                                                     Post partum procedures:none  Augmentation: Pitocin, Cytotec and Foley Balloon  Complications: None  Hospital course:  Induction of Labor With Cesarean Section  [age] y.o. yo G1P1001 at 5670w3d was admitted to the hospital 02/21/2016 for induction of labor. Patient had a labor course significant for triple I. The patient went for cesarean section due to failure to progress, and delivered a Viable infant. Membrane Rupture Time/Date: 6:25 PM ,02/22/2016 . Details of operation can be found in separate operative Note.  Patient had an uncomplicated postpartum course. She is ambulating, tolerating a regular diet, passing flatus, and urinating well.  Patient is discharged home in stable condition on 02/26/16.                                     Physical exam Vitals:   02/25/16 1745 02/25/16 1757 02/25/16 1859 02/26/16 0541  BP: 131/74  122/76 110/64  Pulse: (!) 102 85 86 67  Resp: 18 18 18 18   Temp:   98.2 F (36.8 C) 97.7 F (36.5 C)  TempSrc:   Oral Oral   SpO2: 100% 99%    Weight:      Height:       General: alert, cooperative and no distress Lochia: appropriate Uterine Fundus: firm Incision: Healing well with no significant drainage DVT Evaluation: No evidence of DVT seen on physical exam. Labs: Lab Results  Component Value Date   WBC 16.1 (H) 02/24/2016   HGB 9.2 (L) 02/24/2016   HCT 27.3 (L) 02/24/2016   MCV 87.8 02/24/2016   PLT 217 02/24/2016   CMP Latest Ref Rng & Units 02/23/2016  Glucose 65 - 99 mg/dL 191(Y156(H)  BUN 6 - 20 mg/dL 14  Creatinine 7.820.44 - 9.561.00 mg/dL 2.130.75  Sodium 086135 - 578145 mmol/L 134(L)  Potassium 3.5 - 5.1 mmol/L 4.3  Chloride 101 - 111 mmol/L 104  CO2 22 - 32 mmol/L 20(L)  Calcium 8.9 - 10.3 mg/dL 4.6(N8.7(L)  Total Protein 6.5 - 8.1 g/dL 6.2(L)  Total Bilirubin 0.3 - 1.2 mg/dL 0.7  Alkaline Phos 38 - 126 U/L 154(H)  AST 15 - 41 U/L 29  ALT 14 - 54 U/L  24    Discharge instruction: per After Visit Summary and "Baby and Me Booklet".  Diet: low salt diet  Activity: Advance as tolerated. Pelvic rest for 6 weeks.   Outpatient follow up:2 weeks for wound check, 6 weeks for post-partum visit. Follow up Appt:Future Appointments Date Time Provider Department Center  04/01/2016 10:00 AM Lorne Skeens, MD [facility]   Postpartum contraception: Nexplanon  Newborn Data: Live born female  Birth Weight: 8 lb 2.5 oz (3700 g) APGAR: 8, 9  Baby Feeding: Bottle and Breast Disposition:home with mother   02/26/2016 Hilton Sinclair, MD  OB FELLOW DISCHARGE ATTESTATION  I have seen and examined this patient and agree with above documentation in the resident's note.   Silvano Bilis, MD 2:12 PM

## 2016-03-05 ENCOUNTER — Encounter (HOSPITAL_COMMUNITY): Payer: Self-pay

## 2016-04-01 ENCOUNTER — Ambulatory Visit: Payer: Self-pay | Admitting: Obstetrics and Gynecology

## 2016-04-23 ENCOUNTER — Encounter: Payer: Self-pay | Admitting: Family

## 2016-04-23 ENCOUNTER — Ambulatory Visit (INDEPENDENT_AMBULATORY_CARE_PROVIDER_SITE_OTHER): Payer: Self-pay | Admitting: Family

## 2016-04-23 DIAGNOSIS — Z8632 Personal history of gestational diabetes: Secondary | ICD-10-CM

## 2016-04-23 NOTE — Patient Instructions (Signed)
2 hour glucose test

## 2016-04-23 NOTE — Progress Notes (Signed)
Subjective:     Alice BroachRosa Romero Velez is a 20 y.o. female who presents for a postpartum visit. She is 8 weeks postpartum following a low cervical transverse Cesarean section. I have fully reviewed the prenatal and intrapartum course. The delivery was at 39 gestational weeks. Pregnancy induced due to GDM.  Outcome: primary cesarean section, low transverse incision. Anesthesia: spinal. Postpartum course has been unremarkable. Baby's course has been unremarkable. Baby is feeding by bottle - Similac Advance. Bleeding no bleeding. Bowel function is normal. Bladder function is normal. Patient is not sexually active. Contraception method is none. Postpartum depression screening: negative.  The following portions of the patient's history were reviewed and updated as appropriate: allergies, current medications, past family history, past medical history, past social history, past surgical history and problem list.  Review of Systems Pertinent items are noted in HPI.   Objective:    BP 104/81   Pulse 85   Wt 134 lb 11.2 oz (61.1 kg)   Breastfeeding? No   BMI 27.21 kg/m         General:  alert, cooperative and appears stated age   Breasts:  inspection negative, no nipple discharge or bleeding, no masses or nodularity palpable  Lungs: clear to auscultation bilaterally  Heart:  regular rate and rhythm, S1, S2 normal, no murmur, click, rub or gallop  Abdomen: soft, non-tender; bowel sounds normal; no masses,  no organomegaly; incision site well healed without signs of infection  Pelvic exam not indicated - not bleeding, no pain at vaginal area Assessment:     Normal postpartum exam. Pap smear not done at today's visit (Nov 16, Negative).   Plan:    1. Contraception: none; [facility]ires [facility]on[facility]. Plans to schedule 2 hr  3. Follow up  as needed.    Eino FarberWalidah Kennith GainN Karim, CNM

## 2016-09-15 ENCOUNTER — Encounter (HOSPITAL_COMMUNITY): Payer: Self-pay | Admitting: Emergency Medicine

## 2016-09-15 ENCOUNTER — Emergency Department (HOSPITAL_COMMUNITY)
Admission: EM | Admit: 2016-09-15 | Discharge: 2016-09-15 | Disposition: A | Discharge status: Home / Self Care | Payer: Self-pay | Attending: Emergency Medicine | Admitting: Emergency Medicine

## 2016-09-15 DIAGNOSIS — R102 Pelvic and perineal pain: Secondary | ICD-10-CM

## 2016-09-15 DIAGNOSIS — Z7984 Long term (current) use of oral hypoglycemic drugs: Secondary | ICD-10-CM | POA: Insufficient documentation

## 2016-09-15 DIAGNOSIS — B3731 Acute candidiasis of vulva and vagina: Secondary | ICD-10-CM

## 2016-09-15 DIAGNOSIS — R110 Nausea: Secondary | ICD-10-CM

## 2016-09-15 DIAGNOSIS — B373 Candidiasis of vulva and vagina: Secondary | ICD-10-CM | POA: Insufficient documentation

## 2016-09-15 DIAGNOSIS — D72829 Elevated white blood cell count, unspecified: Secondary | ICD-10-CM | POA: Insufficient documentation

## 2016-09-15 DIAGNOSIS — N390 Urinary tract infection, site not specified: Secondary | ICD-10-CM | POA: Insufficient documentation

## 2016-09-15 DIAGNOSIS — E1165 Type 2 diabetes mellitus with hyperglycemia: Secondary | ICD-10-CM | POA: Insufficient documentation

## 2016-09-15 DIAGNOSIS — Z79899 Other long term (current) drug therapy: Secondary | ICD-10-CM | POA: Insufficient documentation

## 2016-09-15 LAB — URINALYSIS, ROUTINE W REFLEX MICROSCOPIC
BILIRUBIN URINE: NEGATIVE
Glucose, UA: NEGATIVE mg/dL
KETONES UR: NEGATIVE mg/dL
Nitrite: POSITIVE — AB
PH: 6 (ref 5.0–8.0)
Protein, ur: 100 mg/dL — AB
Specific Gravity, Urine: 1.008 (ref 1.005–1.030)

## 2016-09-15 LAB — CBC WITH DIFFERENTIAL/PLATELET
Basophils Absolute: 0 10*3/uL (ref 0.0–0.1)
Basophils Relative: 0 %
EOS ABS: 0.1 10*3/uL (ref 0.0–0.7)
EOS PCT: 0 %
HCT: 40.5 % (ref 36.0–46.0)
Hemoglobin: 13.9 g/dL (ref 12.0–15.0)
LYMPHS ABS: 1.4 10*3/uL (ref 0.7–4.0)
LYMPHS PCT: 8 %
MCH: 28.1 pg (ref 26.0–34.0)
MCHC: 34.3 g/dL (ref 30.0–36.0)
MCV: 81.8 fL (ref 78.0–100.0)
MONOS PCT: 8 %
Monocytes Absolute: 1.5 10*3/uL — ABNORMAL HIGH (ref 0.1–1.0)
Neutro Abs: 15.2 10*3/uL — ABNORMAL HIGH (ref 1.7–7.7)
Neutrophils Relative %: 84 %
PLATELETS: 339 10*3/uL (ref 150–400)
RBC: 4.95 MIL/uL (ref 3.87–5.11)
RDW: 13.8 % (ref 11.5–15.5)
WBC: 18.1 10*3/uL — ABNORMAL HIGH (ref 4.0–10.5)

## 2016-09-15 LAB — COMPREHENSIVE METABOLIC PANEL
ALT: 77 U/L — AB (ref 14–54)
ANION GAP: 9 (ref 5–15)
AST: 28 U/L (ref 15–41)
Albumin: 4.4 g/dL (ref 3.5–5.0)
Alkaline Phosphatase: 92 U/L (ref 38–126)
BUN: 13 mg/dL (ref 6–20)
CALCIUM: 9.3 mg/dL (ref 8.9–10.3)
CHLORIDE: 103 mmol/L (ref 101–111)
CO2: 23 mmol/L (ref 22–32)
CREATININE: 0.6 mg/dL (ref 0.44–1.00)
Glucose, Bld: 190 mg/dL — ABNORMAL HIGH (ref 65–99)
Potassium: 3.5 mmol/L (ref 3.5–5.1)
SODIUM: 135 mmol/L (ref 135–145)
Total Bilirubin: 0.8 mg/dL (ref 0.3–1.2)
Total Protein: 8.1 g/dL (ref 6.5–8.1)

## 2016-09-15 LAB — POC URINE PREG, ED: Preg Test, Ur: NEGATIVE

## 2016-09-15 LAB — LIPASE, BLOOD: LIPASE: 27 U/L (ref 11–51)

## 2016-09-15 MED ORDER — FLUCONAZOLE 150 MG PO TABS
150.0000 mg | ORAL_TABLET | Freq: Once | ORAL | Status: AC
  Administered 2016-09-15: 150 mg via ORAL
  Filled 2016-09-15: qty 1

## 2016-09-15 MED ORDER — FLUCONAZOLE 150 MG PO TABS: 150.0000 mg | ORAL_TABLET | Freq: Once | ORAL | 0 refills | Status: AC

## 2016-09-15 MED ORDER — ONDANSETRON HCL 8 MG PO TABS: 8.0000 mg | ORAL_TABLET | Freq: Three times a day (TID) | ORAL | 0 refills | Status: DC | PRN

## 2016-09-15 MED ORDER — KETOROLAC TROMETHAMINE 30 MG/ML IJ SOLN
30.0000 mg | Freq: Once | INTRAMUSCULAR | Status: AC
  Administered 2016-09-15: 30 mg via INTRAVENOUS
  Filled 2016-09-15: qty 1

## 2016-09-15 MED ORDER — ONDANSETRON HCL 4 MG/2ML IJ SOLN
4.0000 mg | Freq: Once | INTRAMUSCULAR | Status: AC
  Administered 2016-09-15: 4 mg via INTRAVENOUS
  Filled 2016-09-15: qty 2

## 2016-09-15 MED ORDER — SODIUM CHLORIDE 0.9 % IV BOLUS (SEPSIS)
1000.0000 mL | Freq: Once | INTRAVENOUS | Status: AC
  Administered 2016-09-15: 1000 mL via INTRAVENOUS

## 2016-09-15 MED ORDER — SULFAMETHOXAZOLE-TRIMETHOPRIM 800-160 MG PO TABS: 1.0000 | ORAL_TABLET | Freq: Two times a day (BID) | ORAL | 0 refills | Status: AC

## 2016-09-15 NOTE — ED Triage Notes (Signed)
Per pt, states back pain and difficulty urinating since yesterday

## 2016-09-15 NOTE — ED Notes (Signed)
PT DISCHARGED. INSTRUCTIONS AND PRESCRIPTIONS GIVEN. AAOX4. PT IN NO APPARENT DISTRESS. THE OPPORTUNITY TO ASK QUESTIONS WAS PROVIDED. 

## 2016-09-15 NOTE — ED Provider Notes (Signed)
[facility]-EMERGENCY DEPT Provider Note   CSN: [id number] Arrival date & time: 09/15/16  1035     History   Chief Complaint Chief Complaint  Patient presents with  . Back Pain  . Dysuria    HPI Alice Velez is a 21 y.o. female with a PMHx of DM2 and ovarian cysts, who presents to the ED with complaints of dysuria and malodorous cloudy dark urine 3 days as well as suprapubic abdominal pain that radiates to her lower back 1 day. She describes the pain as 10/10 constant throbbing suprapubic abdominal pain radiating to her lower back worse with urination and with no treatments tried prior to arrival. She states the dysuria as a burning sensation every time she urinates. She also reports associated nausea. She believes she has a urinary tract infection. She is currently on her menstrual cycle. She denies any fevers, chills, CP, SOB, vomiting, diarrhea, constipation, melena, hematochezia, hematuria, vaginal discharge or itching, numbness, tingling, focal weakness, or any other complaints at this time. She denies any recent travel, suspicious food intake, sick contacts, alcohol use, or chronic NSAID use. She is not currently breast feeding.    The history is provided by the patient and medical records. A language interpreter was used (provider).  Dysuria   This is a new problem. The current episode started more than 2 days ago. The problem occurs every urination. The problem has not changed since onset.The quality of the pain is described as burning. The pain is at a severity of 10/10. The pain is severe. There has been no fever. Associated symptoms include nausea. Pertinent negatives include no chills, no vomiting, no discharge and no hematuria. She has tried nothing for the symptoms.    Past Medical History:  Diagnosis Date  . Gestational diabetes    insulin  . Medical history non-contributory   . Ovarian cyst     Patient Active Problem List   Diagnosis Date Noted  . History of  diabetes mellitus arising in pregnancy 04/23/2016    Past Surgical History:  Procedure Laterality Date  . CESAREAN SECTION N/A 02/23/2016   Procedure: CESAREAN SECTION;  Surgeon: Reva Bores, MD;  Location: [facility] BIRTHING SUITES;  Service: Obstetrics;  Laterality: N/A;  . NO PAST SURGERIES      OB History    Gravida Para Term Preterm AB Living   0 0 1   SAB TAB Ectopic Multiple Live Births   0 0 0 0 1       Home Medications    Prior to Admission medications   Medication Sig Start Date End Date Taking? Authorizing Provider  ferrous sulfate (FERROUSUL) 325 (65 FE) MG tablet Take 1 tablet (325 mg total) by mouth daily with breakfast. 02/26/16   Tillman Sers, DO  ibuprofen (ADVIL,MOTRIN) 600 MG tablet Take 1 tablet (600 mg total) by mouth every 6 (six) hours. 02/26/16   Tillman Sers, DO  metFORMIN (GLUCOPHAGE) 1000 MG tablet Take 1 tablet (1,000 mg total) by mouth 2 (two) times daily with a meal. 02/26/16   Tillman Sers, DO  oxyCODONE-acetaminophen (ROXICET) 5-325 MG tablet Take 1 tablet by mouth every 6 (six) hours as needed for severe pain. 02/26/16   Tillman Sers, DO  Prenatal Vit-Fe Fumarate-FA (PRENATAL MULTIVITAMIN) TABS tablet Take 1 tablet by mouth daily at 12 noon.    Historical Provider, MD  ranitidine (ZANTAC) 150 MG tablet Take 1 tablet (150 mg total) by mouth 2 (two) times daily.  12/23/15   Lisa A Leftwich-Kirby, CNM  senna-docusate (SENOKOT-S) 8.6-50 MG tablet Take 2 tablets by mouth daily. 02/27/16   Tillman Sers, DO    Family History No family history on file.  Social History Social History  Substance Use Topics  . Smoking status: Never Smoker  . Smokeless tobacco: Never Used  . Alcohol use No     Allergies   Patient has no known allergies.   Review of Systems Review of Systems  Constitutional: Negative for chills and fever.  Respiratory: Negative for shortness of breath.   Cardiovascular: Negative for chest pain.  Gastrointestinal:  Positive for nausea. Negative for abdominal pain, blood in stool, constipation, diarrhea and vomiting.  Genitourinary: Positive for dysuria. Negative for hematuria, vaginal discharge and vaginal pain.       +malodorous cloudy darker urine  Musculoskeletal: Positive for back pain. Negative for arthralgias and myalgias.  Skin: Negative for color change.  Allergic/Immunologic: Positive for immunocompromised state (DM2).  Neurological: Negative for weakness and numbness.  Psychiatric/Behavioral: Negative for confusion.   All other systems reviewed and are negative for acute change except as noted in the HPI.    Physical Exam Updated Vital Signs BP 116/85 (BP Location: Left Arm)   Pulse 97   Temp 98.3 F (36.8 C) (Oral)   Resp 16   Ht  (1.6 m)   Wt 61.7 kg   SpO2 100%   BMI 24.09 kg/m   Physical Exam  Constitutional: She is oriented to person, place, and time. Vital signs are normal. She appears well-developed and well-nourished.  Non-toxic appearance. No distress.  Afebrile, nontoxic, NAD  HENT:  Head: Normocephalic and atraumatic.  Mouth/Throat: Oropharynx is clear and moist and mucous membranes are normal.  Eyes: Conjunctivae and EOM are normal. Right eye exhibits no discharge. Left eye exhibits no discharge.  Neck: Normal range of motion. Neck supple.  Cardiovascular: Normal rate, regular rhythm, normal heart sounds and intact distal pulses.  Exam reveals no gallop and no friction rub.   No murmur heard. Pulmonary/Chest: Effort normal and breath sounds normal. No respiratory distress. She has no decreased breath sounds. She has no wheezes. She has no rhonchi. She has no rales.  Abdominal: Soft. Normal appearance and bowel sounds are normal. She exhibits no distension. There is generalized tenderness and tenderness in the suprapubic area. There is no rigidity, no rebound, no guarding, no CVA tenderness, no tenderness at McBurney's point and negative Murphy's sign.  Soft,  nondistended, +BS throughout, with mild generalized TTP throughout but most focally in the suprapubic region, no r/g/r, neg murphy's, neg mcburney's, no CVA TTP   Musculoskeletal: Normal range of motion.  Neurological: She is alert and oriented to person, place, and time. She has normal strength. No sensory deficit.  Skin: Skin is warm, dry and intact. No rash noted.  Psychiatric: She has a normal mood and affect.  Nursing note and vitals reviewed.    ED Treatments / Results  Labs (all labs ordered are listed, but only abnormal results are displayed) Labs Reviewed  URINALYSIS, ROUTINE W REFLEX MICROSCOPIC - Abnormal; Notable for the following:       Result Value   APPearance HAZY (*)    Hgb urine dipstick LARGE (*)    Protein, ur 100 (*)    Nitrite POSITIVE (*)    Leukocytes, UA LARGE (*)    Bacteria, UA MANY (*)    Squamous Epithelial / LPF 0-5 (*)    All other components within  normal limits  CBC WITH DIFFERENTIAL/PLATELET - Abnormal; Notable for the following:    WBC 18.1 (*)    Neutro Abs 15.2 (*)    Monocytes Absolute 1.5 (*)    All other components within normal limits  COMPREHENSIVE METABOLIC PANEL - Abnormal; Notable for the following:    Glucose, Bld 190 (*)    ALT 77 (*)    All other components within normal limits  LIPASE, BLOOD  POC URINE PREG, ED    EKG  EKG Interpretation None       Radiology No results found.  Procedures Procedures (including critical care time)  Medications Ordered in ED Medications  ondansetron (ZOFRAN) injection 4 mg (4 mg Intravenous Given 09/15/16 1314)  ketorolac (TORADOL) 30 MG/ML injection 30 mg (30 mg Intravenous Given 09/15/16 1314)  sodium chloride 0.9 % bolus 1,000 mL (0 mLs Intravenous Stopped 09/15/16 1437)  fluconazole (DIFLUCAN) tablet 150 mg (150 mg Oral Given 09/15/16 1304)     Initial Impression / Assessment and Plan / ED Course  I have reviewed the triage vital signs and the nursing notes.  Pertinent labs &  imaging results that were available during my care of the patient were reviewed by me and considered in my medical decision making (see chart for details).     21 y.o. female here with dysuria/malodorous cloudy darker urine x3 days and suprapubic pain radiating to her lower back and nausea x1 day. On exam, mild suprapubic TTP with somewhat generalized tenderness throughout rest of abd as well but not as much as in the suprapubic area; nonperitoneal. No flank tenderness. Upreg neg. U/A with +nitrites, large leuks, TNTC WBC, many bacteria, and only 0-5 squamous. Also shows budding yeast. Pt denies vaginal complaints, however she is diabetic so yeast vaginitis would be common. Overall symptoms likely UTI, however given her generalized abd pain, will get basic labs, give fluids/zofran/toradol, and reassess shortly. Will give diflucan now and likely another dose to take home for AFTER abx course. Will reassess shortly.   3:01 PM CBC w/diff with mildly elevated WBC 18.1 but could be from hemoconcentration. CMP with gluc 190 and ALT mildly elevated at 77, unclear significance of ALT elevation but otherwise unremarkable on remainder of CMP. Lipase WNL. Pt feeling better and tolerating PO well. Will send home with bactrim x1wk to treat for early pyelonephritis given leukocytosis and UTI with back pain, although pain is not near flank region. Rx given for zofran and diflucan to take after abx course is done. Advised OTC remedies for symptomatic relief. F/up with [facility] in 1wk for recheck of symptoms and to establish medical care. I explained the diagnosis and have given explicit precautions to return to the ER including for any other new or worsening symptoms. The patient understands and accepts the medical plan as it's been dictated and I have answered their questions. Discharge instructions concerning home care and prescriptions have been given. The patient is STABLE and is discharged to home in good condition.     Final Clinical Impressions(s) / ED Diagnoses   Final diagnoses:  Lower urinary tract infectious disease  Vaginal yeast infection  Suprapubic abdominal pain  Leukocytosis, unspecified type  Type 2 diabetes mellitus with hyperglycemia, without long-term current use of insulin (HCC)  Nausea    New Prescriptions New Prescriptions   FLUCONAZOLE (DIFLUCAN) 150 MG TABLET    Take 1 tablet (150 mg total) by mouth once. TAKE AFTER YOU'RE FINISHED WITH THE BACTRIM ANTIBIOTIC   ONDANSETRON (ZOFRAN) 8 MG  TABLET    Take 1 tablet (8 mg total) by mouth every 8 (eight) hours as needed for nausea or vomiting.   SULFAMETHOXAZOLE-TRIMETHOPRIM (BACTRIM DS,SEPTRA DS) 800-160 MG TABLET    Take 1 tablet by mouth 2 (two) times daily.     [address] [place] Maka, PA-C 09/15/16 1501    Lyndal Pulley, MD 09/15/16 [phone]

## 2016-09-15 NOTE — Discharge Instructions (Signed)
Stay very well hydrated with plenty of water throughout the day. Take antibiotic until completed. Alternate between tylenol and motrin as needed for pain. Use zofran as directed as needed for nausea. Take the second dose of diflucan AFTER you're finished with the antibiotic; your first dose was given today. Follow up with [facility] in 1 week for recheck of ongoing symptoms and to establish medical care, but return to ER for emergent changing or worsening of symptoms.  Please seek immediate care if you develop the following: You develop worsening back pain.  Your symptoms are no better, or worse in 3 days. There is severe back pain or lower abdominal pain.  You develop chills.  You have a fever.  There is nausea or vomiting.  There is continued burning or discomfort with urination.

## 2016-09-17 ENCOUNTER — Emergency Department (HOSPITAL_COMMUNITY)
Admission: EM | Admit: 2016-09-17 | Discharge: 2016-09-17 | Disposition: A | Discharge status: Home / Self Care | Payer: Self-pay | Attending: Emergency Medicine | Admitting: Emergency Medicine

## 2016-09-17 DIAGNOSIS — Z79899 Other long term (current) drug therapy: Secondary | ICD-10-CM | POA: Insufficient documentation

## 2016-09-17 DIAGNOSIS — N12 Tubulo-interstitial nephritis, not specified as acute or chronic: Secondary | ICD-10-CM | POA: Insufficient documentation

## 2016-09-17 DIAGNOSIS — Z7984 Long term (current) use of oral hypoglycemic drugs: Secondary | ICD-10-CM | POA: Insufficient documentation

## 2016-09-17 LAB — COMPREHENSIVE METABOLIC PANEL
ALT: 50 U/L (ref 14–54)
AST: 33 U/L (ref 15–41)
Albumin: 3.2 g/dL — ABNORMAL LOW (ref 3.5–5.0)
Alkaline Phosphatase: 114 U/L (ref 38–126)
Anion gap: 10 (ref 5–15)
BUN: 17 mg/dL (ref 6–20)
CO2: 23 mmol/L (ref 22–32)
Calcium: 9.1 mg/dL (ref 8.9–10.3)
Chloride: 100 mmol/L — ABNORMAL LOW (ref 101–111)
Creatinine, Ser: 1.23 mg/dL — ABNORMAL HIGH (ref 0.44–1.00)
GFR calc Af Amer: >60 mL/min (ref >60)
GFR calc non Af Amer: >60 mL/min (ref >60)
Glucose, Bld: 325 mg/dL — ABNORMAL HIGH (ref 65–99)
Potassium: 4.1 mmol/L (ref 3.5–5.1)
Sodium: 133 mmol/L — ABNORMAL LOW (ref 135–145)
Total Bilirubin: 1 mg/dL (ref 0.3–1.2)
Total Protein: 7.5 g/dL (ref 6.5–8.1)

## 2016-09-17 LAB — URINALYSIS, ROUTINE W REFLEX MICROSCOPIC
Bilirubin Urine: NEGATIVE
Glucose, UA: >=500 mg/dL — AB
Ketones, ur: NEGATIVE mg/dL
Nitrite: POSITIVE — AB
Protein, ur: 100 mg/dL — AB
Specific Gravity, Urine: 1.018 (ref 1.005–1.030)
pH: 5 (ref 5.0–8.0)

## 2016-09-17 LAB — CBC
HCT: 36.4 % (ref 36.0–46.0)
Hemoglobin: 12.1 g/dL (ref 12.0–15.0)
MCH: 28.1 pg (ref 26.0–34.0)
MCHC: 33.2 g/dL (ref 30.0–36.0)
MCV: 84.7 fL (ref 78.0–100.0)
Platelets: 237 10*3/uL (ref 150–400)
RBC: 4.3 MIL/uL (ref 3.87–5.11)
RDW: 14.2 % (ref 11.5–15.5)
WBC: 18.2 10*3/uL — ABNORMAL HIGH (ref 4.0–10.5)

## 2016-09-17 LAB — I-STAT BETA HCG BLOOD, ED (MC, WL, AP ONLY): I-stat hCG, quantitative: 8 m[IU]/mL — ABNORMAL HIGH (ref <5)

## 2016-09-17 LAB — PREGNANCY, URINE: Preg Test, Ur: NEGATIVE

## 2016-09-17 LAB — LIPASE, BLOOD: Lipase: 11 U/L (ref 11–51)

## 2016-09-17 MED ORDER — KETOROLAC TROMETHAMINE 15 MG/ML IJ SOLN
15.0000 mg | Freq: Once | INTRAMUSCULAR | Status: AC
  Administered 2016-09-17: 15 mg via INTRAVENOUS
  Filled 2016-09-17: qty 1

## 2016-09-17 MED ORDER — SODIUM CHLORIDE 0.9 % IV BOLUS (SEPSIS)
1000.0000 mL | Freq: Once | INTRAVENOUS | Status: AC
  Administered 2016-09-17: 1000 mL via INTRAVENOUS

## 2016-09-17 MED ORDER — ACETAMINOPHEN 325 MG PO TABS
650.0000 mg | ORAL_TABLET | Freq: Once | ORAL | Status: AC
  Administered 2016-09-17: 650 mg via ORAL
  Filled 2016-09-17: qty 2

## 2016-09-17 MED ORDER — HYDROMORPHONE HCL 1 MG/ML IJ SOLN
0.7500 mg | Freq: Once | INTRAMUSCULAR | Status: AC
Start: 2016-09-17 — End: 2016-09-17
  Administered 2016-09-17: 0.75 mg via INTRAVENOUS
  Filled 2016-09-17: qty 1

## 2016-09-17 MED ORDER — CEPHALEXIN 500 MG PO CAPS: 500.0000 mg | ORAL_CAPSULE | Freq: Three times a day (TID) | ORAL | 0 refills | Status: DC

## 2016-09-17 MED ORDER — IBUPROFEN 400 MG PO TABS
400.0000 mg | ORAL_TABLET | Freq: Four times a day (QID) | ORAL | 0 refills | Status: DC | PRN
Start: 2016-09-17 — End: 2023-11-01

## 2016-09-17 MED ORDER — OXYCODONE-ACETAMINOPHEN 5-325 MG PO TABS: 1.0000 | ORAL_TABLET | ORAL | 0 refills | Status: DC | PRN

## 2016-09-17 MED ORDER — DEXTROSE 5 % IV SOLN
2.0000 g | Freq: Once | INTRAVENOUS | Status: AC
  Administered 2016-09-17: 2 g via INTRAVENOUS
  Filled 2016-09-17: qty 2

## 2016-09-17 MED ORDER — MORPHINE SULFATE (PF) 4 MG/ML IV SOLN
4.0000 mg | Freq: Once | INTRAVENOUS | Status: AC
  Administered 2016-09-17: 4 mg via INTRAVENOUS
  Filled 2016-09-17: qty 1

## 2016-09-17 NOTE — ED Triage Notes (Signed)
Pt c/o abdominal pain, nausea, onset 09/15/16, given medication but was vomiting. No diarrhea.

## 2016-09-17 NOTE — ED Provider Notes (Signed)
[facility]-EMERGENCY DEPT Provider Note   CSN: [id number] Arrival date & time: 09/17/16  1533  By signing my name below, I, Nelwyn Salisbury, attest that this documentation has been prepared under the direction and in the presence of Raeford Razor, MD. Electronically Signed: Nelwyn Salisbury, Scribe. 09/17/2016. 6:15 PM.  History   Chief Complaint Chief Complaint  Patient presents with  . Nausea  . Abdominal Pain   The history is provided by the patient. No language interpreter was used.    HPI Comments:  Alice Velez is a 21 y.o. female with no pertinent pmhx who presents to the Emergency Department complaining of worsening, constant abdominal pain onset 1 week. She reports associated vomiting, nausea, headache, fever, chills, and neck pain. Pt was seen 4 days ago for similar symptoms and was dx with a urinary tract infection. She was given abx and has been compliant with these medications with no relief. Pt denies any vaginal bleeding or vaginal discharge. She is not sexually active.   Past Medical History:  Diagnosis Date  . Gestational diabetes    insulin  . Medical history non-contributory   . Ovarian cyst     Patient Active Problem List   Diagnosis Date Noted  . History of diabetes mellitus arising in pregnancy 04/23/2016    Past Surgical History:  Procedure Laterality Date  . CESAREAN SECTION N/A 02/23/2016   Procedure: CESAREAN SECTION;  Surgeon: Reva Bores, MD;  Location: [facility] BIRTHING SUITES;  Service: Obstetrics;  Laterality: N/A;  . NO PAST SURGERIES      OB History    Gravida Para Term Preterm AB Living   0 0 1   SAB TAB Ectopic Multiple Live Births   0 0 0 0 1       Home Medications    Prior to Admission medications   Medication Sig Start Date End Date Taking? Authorizing Provider  ferrous sulfate (FERROUSUL) 325 (65 FE) MG tablet Take 1 tablet (325 mg total) by mouth daily with breakfast. 02/26/16   Tillman Sers, DO  ibuprofen (ADVIL,MOTRIN) 600  MG tablet Take 1 tablet (600 mg total) by mouth every 6 (six) hours. 02/26/16   Tillman Sers, DO  metFORMIN (GLUCOPHAGE) 1000 MG tablet Take 1 tablet (1,000 mg total) by mouth 2 (two) times daily with a meal. 02/26/16   Tillman Sers, DO  ondansetron (ZOFRAN) 8 MG tablet Take 1 tablet (8 mg total) by mouth every 8 (eight) hours as needed for nausea or vomiting. 09/15/16   Mercedes Street, PA-C  oxyCODONE-acetaminophen (ROXICET) 5-325 MG tablet Take 1 tablet by mouth every 6 (six) hours as needed for severe pain. 02/26/16   Tillman Sers, DO  Prenatal Vit-Fe Fumarate-FA (PRENATAL MULTIVITAMIN) TABS tablet Take 1 tablet by mouth daily at 12 noon.    Historical Provider, MD  ranitidine (ZANTAC) 150 MG tablet Take 1 tablet (150 mg total) by mouth 2 (two) times daily. 12/23/15   Lisa A Leftwich-Kirby, CNM  senna-docusate (SENOKOT-S) 8.6-50 MG tablet Take 2 tablets by mouth daily. 02/27/16   Tillman Sers, DO  sulfamethoxazole-trimethoprim (BACTRIM DS,SEPTRA DS) 800-160 MG tablet Take 1 tablet by mouth 2 (two) times daily. 09/15/16 09/22/16  Mercedes Street, PA-C    Family History No family history on file.  Social History Social History  Substance Use Topics  . Smoking status: Never Smoker  . Smokeless tobacco: Never Used  . Alcohol use No     Allergies   Patient  has no known allergies.   Review of Systems Review of Systems  Constitutional: Positive for chills and fever.  Gastrointestinal: Positive for abdominal pain, nausea and vomiting.  Genitourinary: Negative for vaginal bleeding and vaginal discharge.  Neurological: Positive for headaches.  All other systems reviewed and are negative.    Physical Exam Updated Vital Signs BP 98/60 (BP Location: Left Arm)   Pulse (!) 122   Temp 99.1 F (37.3 C) (Oral)   Resp 18   Wt 136 lb 9.6 oz (62 kg)   SpO2 100%   BMI 24.20 kg/m   Physical Exam  Constitutional: She is oriented to person, place, and time. She appears well-developed  and well-nourished. No distress.  HENT:  Head: Normocephalic and atraumatic.  Eyes: EOM are normal.  Neck: Normal range of motion.  Cardiovascular: Regular rhythm and normal heart sounds.  Tachycardia present.   Pulmonary/Chest: Effort normal and breath sounds normal.  Abdominal: Soft. She exhibits no distension. There is tenderness.  Suprapubic, RLQ tenderness. Right CVA tenderness.   Musculoskeletal: Normal range of motion.  Neurological: She is alert and oriented to person, place, and time.  Skin: Skin is warm and dry.  Psychiatric: She has a normal mood and affect. Judgment normal.  Nursing note and vitals reviewed.    ED Treatments / Results  DIAGNOSTIC STUDIES:  Oxygen Saturation is 100% on RA, normal by my interpretation.    COORDINATION OF CARE:  6:28 PM Discussed treatment plan with pt at bedside which includes IV medications and abx and pt agreed to plan.  Labs (all labs ordered are listed, but only abnormal results are displayed) Labs Reviewed  COMPREHENSIVE METABOLIC PANEL - Abnormal; Notable for the following:       Result Value   Sodium 133 (*)    Chloride 100 (*)    Glucose, Bld 325 (*)    Creatinine, Ser 1.23 (*)    Albumin 3.2 (*)    All other components within normal limits  CBC - Abnormal; Notable for the following:    WBC 18.2 (*)    All other components within normal limits  URINALYSIS, ROUTINE W REFLEX MICROSCOPIC - Abnormal; Notable for the following:    APPearance CLOUDY (*)    Glucose, UA >=500 (*)    Hgb urine dipstick LARGE (*)    Protein, ur 100 (*)    Nitrite POSITIVE (*)    Leukocytes, UA SMALL (*)    Bacteria, UA MANY (*)    Squamous Epithelial / LPF 6-30 (*)    All other components within normal limits  I-STAT BETA HCG BLOOD, ED ([facility], [facility], [facility] ONLY) - Abnormal; Notable for the following:    I-stat hCG, quantitative 8.0 (*)    All other components within normal limits  LIPASE, BLOOD    EKG  EKG Interpretation None        Radiology No results found.  Procedures Procedures (including critical care time)  Medications Ordered in ED Medications - No data to display   Initial Impression / Assessment and Plan / ED Course  I have reviewed the triage vital signs and the nursing notes.  Pertinent labs & imaging results that were available during my care of the patient were reviewed by me and considered in my medical decision making (see chart for details).     21yF presenting with worsening symptoms of uti/pyelo. On bactrim. No culture sent previously. I can only assume resistant. abx class changed. Culture sent. I feel still appropriate for outpt  tx. language interpretor used to explain.   Final Clinical Impressions(s) / ED Diagnoses   Final diagnoses:  Pyelonephritis    New Prescriptions New Prescriptions   No medications on file   .I personally preformed the services scribed in my presence. The recorded information has been reviewed is accurate. Raeford Razor, MD.     Raeford Razor, MD 09/23/16 [phone]

## 2016-09-18 ENCOUNTER — Encounter (HOSPITAL_COMMUNITY): Payer: Self-pay | Admitting: Emergency Medicine

## 2016-09-20 LAB — URINE CULTURE: Culture: >=100000 — AB

## 2016-09-21 ENCOUNTER — Telehealth: Payer: Self-pay | Admitting: Emergency Medicine

## 2016-09-21 NOTE — Telephone Encounter (Signed)
Post ED Visit - Positive Culture Follow-up  Culture report reviewed by antimicrobial stewardship pharmacist:   Enzo Bi, Pharm.D.  Celedonio Miyamoto, Pharm.D., BCPS AQ-ID  Garvin Fila, Pharm.D., BCPS  Georgina Pillion, Pharm.D., BCPS  [place], [address].D., BCPS, AAHIVP  Estella Husk, Pharm.D., BCPS, AAHIVP  Lysle Pearl, PharmD, BCPS  Casilda Carls, PharmD, BCPS  Pollyann Samples, PharmD, BCPS  Positive urine culture Treated with cephalexin, organism sensitive to the same and no further patient follow-up is required at this time.  Berle Mull 09/21/2016, 11:46 AM

## 2021-04-28 ENCOUNTER — Telehealth: Payer: Self-pay

## 2021-04-28 NOTE — Telephone Encounter (Signed)
Called patient no answer. Needs an appointment to see [place] here at [facility] for her Neck/arm.  Call #1

## 2021-04-29 ENCOUNTER — Telehealth: Payer: Self-pay

## 2021-04-29 NOTE — Telephone Encounter (Signed)
Called patient no answer. LMOM.  Call #2  

## 2021-04-29 NOTE — Telephone Encounter (Signed)
Pt speaks spanish and asked for a CB.   [phone]

## 2021-04-30 NOTE — Telephone Encounter (Signed)
Called patient no answer. LMOM. Needs appt.

## 2021-04-30 NOTE — Telephone Encounter (Signed)
Called patient no answer LMOM. Needs appt.   Call #3

## 2021-05-21 ENCOUNTER — Other Ambulatory Visit: Payer: Self-pay

## 2021-05-21 ENCOUNTER — Encounter: Payer: Self-pay | Admitting: Surgery

## 2021-05-21 ENCOUNTER — Ambulatory Visit: Payer: Self-pay | Admitting: Surgery

## 2021-05-21 ENCOUNTER — Ambulatory Visit: Payer: Self-pay

## 2021-05-21 VITALS — BP 106/74 | HR 82

## 2021-05-21 DIAGNOSIS — M542 Cervicalgia: Secondary | ICD-10-CM

## 2021-05-21 DIAGNOSIS — M5412 Radiculopathy, cervical region: Secondary | ICD-10-CM

## 2021-05-21 DIAGNOSIS — G5691 Unspecified mononeuropathy of right upper limb: Secondary | ICD-10-CM

## 2021-05-21 NOTE — Progress Notes (Signed)
Office Visit Note   Patient: Alice Velez           Date of Birth: Sep 02, 1995           MRN: [id number] Visit Date: 05/21/2021              Requested by: No referring provider defined for this encounter. PCP: Pcp, No   Assessment & Plan: Visit Diagnoses:  1. Neck pain   2. Radiculopathy, cervical region   3. Neuropathy, arm, right   4.     Possible double crush syndrome 5.      Diabetes    Plan: With the help of interpreter I explained to patient in detail as to what her possible diagnosis is.  I do think that she may benefit from an oral prednisone taper but I explained that with her history of diabetes and I do not feel comfortable prescribing this due to the potential to elevate her blood sugars.  She is scheduled to see her primary care provider this afternoon and I did ask her to have her provider contact me so I can explain to them what I think is going on and see if they feel comfortable with prescribing a prednisone taper.  I did make some notes on a piece of paper for patient to give to the medical doctor.  I also did recommend that she get a removable wrist splint to use while she is asleep at night for her possible carpal tunnel.  Husband will see about ordering one off the Internet.  I also advised him that they could go to [facility] or [facility] for one since they are cheaper than what we have in the office.  Patient will follow-up me in 3 weeks for recheck.  I will make decision at that time as to whether or not cervical spine MRI and NCV/EMG study right upper extremity as needed.  All questions answered.  Follow-Up Instructions: Return in about 3 weeks (around 06/11/2021) for with [facility] for recheck neck.   Orders:  Orders Placed This Encounter  Procedures   XR Cervical Spine 2 or 3 views   No orders of the defined types were placed in this encounter.     Procedures: No procedures performed   Clinical Data: No additional findings.   Subjective: Chief  Complaint  Patient presents with   Neck - Pain    HPI 25 year old Hispanic female who is a new patient to the office comes in with complaints of neck pain, right shoulder pain.  She comes in with the assistance of an interpreter.  Also accompanied by her husband and young child.  States that she is had right-sided neck pain that radiates into the right scapula and right shoulder for about 6 months.  Denies any history of injury.  Describes pain as being constant.  Does get numbness and tingling into the right hand and forearm that she notices more in the morning.  Not taking any NSAIDs.  Denies any left-sided symptoms.  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  Review of Systems No current cardiopulmonary GI GU issues  Objective: Vital Signs: BP 106/74    Pulse 82   Physical Exam Constitutional:      Appearance: Normal appearance.  HENT:     Head: Normocephalic and atraumatic.     Nose: Nose normal.  Eyes:     Extraocular Movements: Extraocular movements intact.  Pulmonary:     Effort: No respiratory  distress.  Musculoskeletal:     Comments: Gait is normal.  Cervical spine good range of motion but with some discomfort.  Positive Spurling test.  Positive right brachial plexus, trapezius and scapular tenderness.  Negative on the left side.  She does have some relief of her right shoulder and scapular pain with cervical distraction.  Bilateral shoulders unremarkable.  Negative impingement test.  Bilateral elbows good range of motion with negative Tinel's over cubital tunnels.  Negative Tinel's left wrist.  Right wrist positive Tinel's and Phalen's test.  No focal motor deficits.  Neurological:     Mental Status: She is alert and oriented to person, place, and time.  Psychiatric:        Mood and Affect: Mood normal.    Ortho Exam  Specialty Comments:  No specialty comments available.  Imaging: No results found.   PMFS History: Patient Active  Problem List   Diagnosis Date Noted   History of diabetes mellitus arising in pregnancy 04/23/2016   Past Medical History:  Diagnosis Date   Gestational diabetes    insulin   Medical history non-contributory    Ovarian cyst     History reviewed. No pertinent family history.  Past Surgical History:  Procedure Laterality Date   CESAREAN SECTION N/A 02/23/2016   Procedure: CESAREAN SECTION;  Surgeon: Reva Bores, MD;  Location: [facility] BIRTHING SUITES;  Service: Obstetrics;  Laterality: N/A;   NO PAST SURGERIES     Social History   Occupational History   Not on file  Tobacco Use   Smoking status: Never   Smokeless tobacco: Never  Substance and Sexual Activity   Alcohol use: No   Drug use: No   Sexual activity: Yes    Birth control/protection: None

## 2021-06-11 ENCOUNTER — Encounter: Payer: Self-pay | Admitting: Surgery

## 2021-06-11 ENCOUNTER — Ambulatory Visit (INDEPENDENT_AMBULATORY_CARE_PROVIDER_SITE_OTHER): Payer: Self-pay | Admitting: Surgery

## 2021-06-11 VITALS — BP 105/72 | HR 93 | Ht 63.0 in | Wt 136.6 lb

## 2021-06-11 DIAGNOSIS — M5412 Radiculopathy, cervical region: Secondary | ICD-10-CM

## 2021-06-11 DIAGNOSIS — M542 Cervicalgia: Secondary | ICD-10-CM

## 2021-06-11 DIAGNOSIS — G5691 Unspecified mononeuropathy of right upper limb: Secondary | ICD-10-CM

## 2021-06-11 NOTE — Progress Notes (Incomplete)
° °  Office Visit Note   Patient: Alice Velez           Date of Birth: 06-13-1995           MRN: [id number] Visit Date: 06/11/2021              Requested by: No referring provider defined for this encounter. PCP: Pcp, No   Assessment & Plan: Visit Diagnoses:  1. Radiculopathy, cervical region   2. Neuropathy, arm, right   3. Cervicalgia     Plan: ***  Follow-Up Instructions: Return in about 3 weeks (around 07/02/2021) for with dr Otelia Sergeant to review mri cervical spine and ncv/emg .   Orders:  Orders Placed This Encounter  Procedures   MR Cervical Spine w/o contrast   Ambulatory referral to Physical Medicine Rehab   No orders of the defined types were placed in this encounter.     Procedures: No procedures performed   Clinical Data: No additional findings.   Subjective: Chief Complaint  Patient presents with   Neck - Follow-up    HPI  Review of Systems   Objective: Vital Signs: BP 105/72    Pulse 93    Ht 5\' 3"  (1.6 m)    Wt 136 lb 9.6 oz (62 kg)    BMI 24.20 kg/m   Physical Exam  Ortho Exam  Specialty Comments:  No specialty comments available.  Imaging: No results found.   PMFS History: Patient Active Problem List   Diagnosis Date Noted   History of diabetes mellitus arising in pregnancy 04/23/2016   Past Medical History:  Diagnosis Date   Gestational diabetes    insulin   Medical history non-contributory    Ovarian cyst     History reviewed. No pertinent family history.  Past Surgical History:  Procedure Laterality Date   CESAREAN SECTION N/A 02/23/2016   Procedure: CESAREAN SECTION;  Surgeon: 04/24/2016, MD;  Location: [facility] BIRTHING SUITES;  Service: Obstetrics;  Laterality: N/A;   NO PAST SURGERIES     Social History   Occupational History   Not on file  Tobacco Use   Smoking status: Never   Smokeless tobacco: Never  Substance and Sexual Activity   Alcohol use: No   Drug use: No   Sexual activity: Yes     Birth control/protection: None

## 2021-06-23 ENCOUNTER — Telehealth: Payer: Self-pay | Admitting: Specialist

## 2021-06-23 NOTE — Telephone Encounter (Signed)
tried to call pt 2x but phone rang then dropped, calling pt to resch appt per Camelia Eng this patient has an appt on 2/13 with Dr. Otelia Sergeant, but her MRI Csp is not until 07/09/21. Please r/s that f/u appt until after 07/09/21.

## 2021-06-25 NOTE — Telephone Encounter (Signed)
LMOM 2x in Spanish for pt to return phone call and get resch'd for a folloing appt with Nitka after 2/15 (helped by [facility])

## 2021-07-07 ENCOUNTER — Ambulatory Visit: Payer: Self-pay | Admitting: Specialist

## 2021-07-09 ENCOUNTER — Inpatient Hospital Stay: Admission: RE | Admit: 2021-07-09 | Payer: Self-pay | Source: Ambulatory Visit

## 2021-07-18 ENCOUNTER — Encounter: Payer: Self-pay | Admitting: Physical Medicine and Rehabilitation

## 2021-08-13 ENCOUNTER — Encounter: Payer: Self-pay | Admitting: Physical Medicine and Rehabilitation

## 2021-08-13 ENCOUNTER — Ambulatory Visit: Payer: Self-pay | Admitting: Specialist

## 2021-08-13 ENCOUNTER — Ambulatory Visit
Admission: RE | Admit: 2021-08-13 | Discharge: 2021-08-13 | Disposition: A | Discharge status: Home / Self Care | Payer: No Typology Code available for payment source | Source: Ambulatory Visit | Attending: Surgery | Admitting: Surgery

## 2021-08-13 ENCOUNTER — Other Ambulatory Visit: Payer: Self-pay

## 2021-08-13 ENCOUNTER — Ambulatory Visit (INDEPENDENT_AMBULATORY_CARE_PROVIDER_SITE_OTHER): Payer: Self-pay | Admitting: Physical Medicine and Rehabilitation

## 2021-08-13 DIAGNOSIS — R202 Paresthesia of skin: Secondary | ICD-10-CM

## 2021-08-13 DIAGNOSIS — M542 Cervicalgia: Secondary | ICD-10-CM

## 2021-08-13 IMAGING — MR MR CERVICAL SPINE W/O CM
6 series · 39 of 48 positions shown · non-contrast
Comparison: Radiographs [DATE]

CLINICAL DATA: Cervicalgia [I2] ([I2]-CM). Neck pain, chronic;
neck pain and right upper extremity radiculopathy.

EXAM:
MRI CERVICAL SPINE WITHOUT CONTRAST
TECHNIQUE: Multiplanar, multisequence MR imaging of the cervical spine was
performed. No intravenous contrast was administered.

[Series 2: T2 · sagittal · 3.0mm · 0.41mm/px · 7 of 17 slices shown (1 of 2)]
[im 1/17]
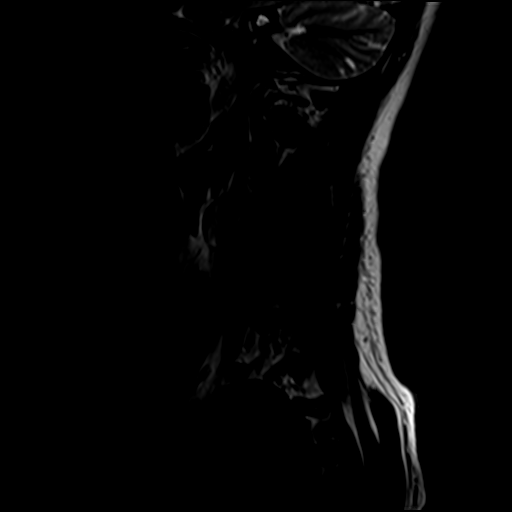
[im 3/17]
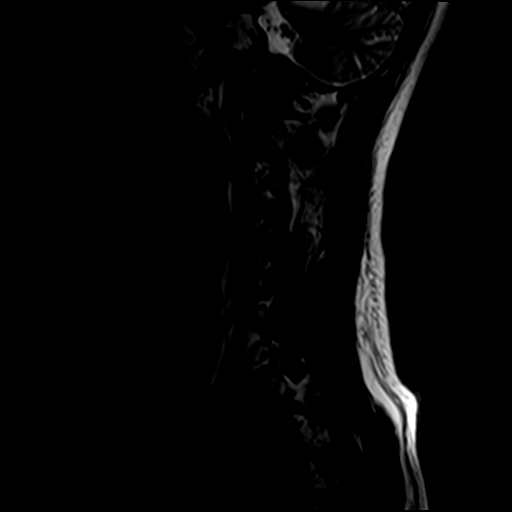
[im 6/17]
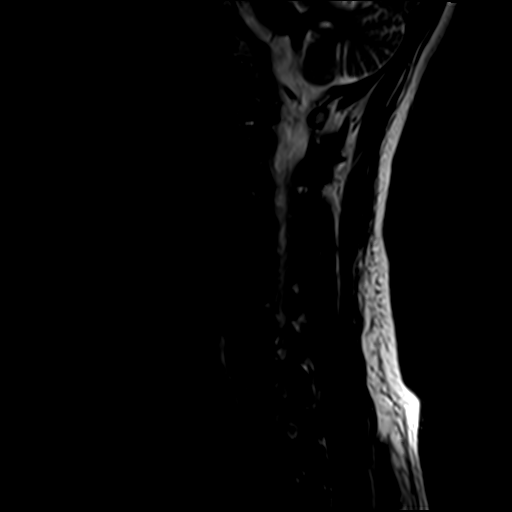
[im 9/17]
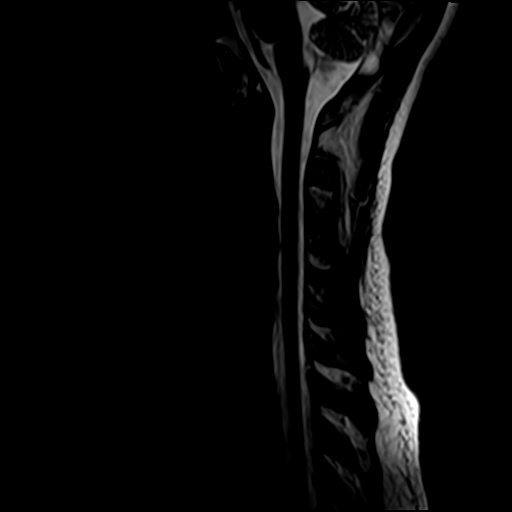
[im 11/17]
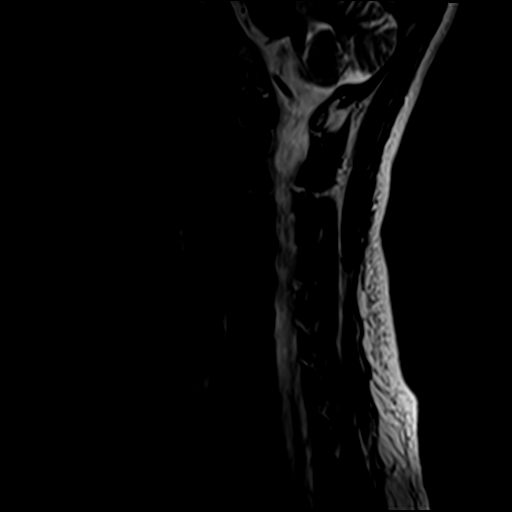
[im 14/17]
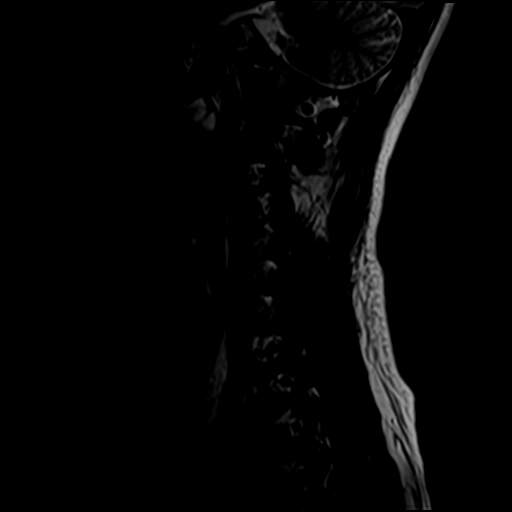
[im 17/17]
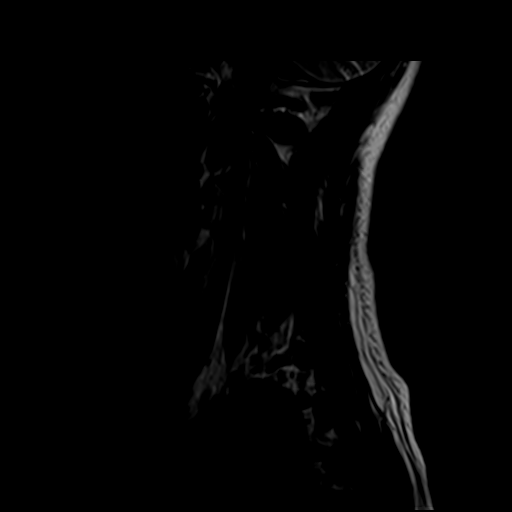

[Series 3: STIR · sagittal · 3.0mm · 0.82mm/px · 7 of 17 slices shown]
[im 1/17]
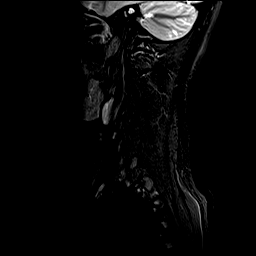
[im 3/17]
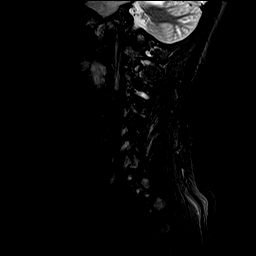
[im 6/17]
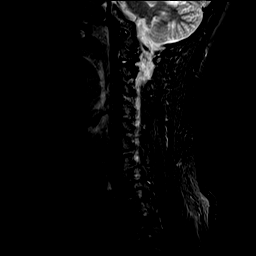
[im 9/17]
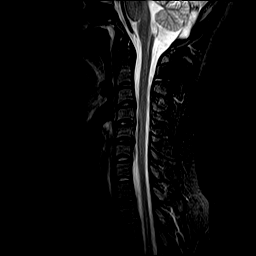
[im 11/17]
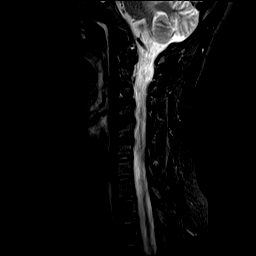
[im 14/17]
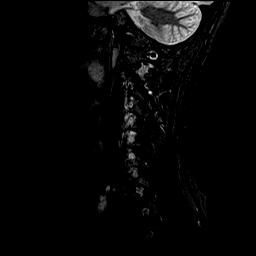
[im 17/17]
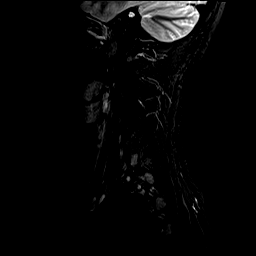

[Series 4: T1 · sagittal · 3.0mm · 0.82mm/px · 7 of 17 slices shown (1 of 2)]
[im 1/17]
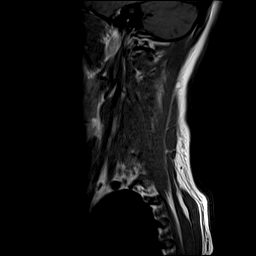
[im 3/17]
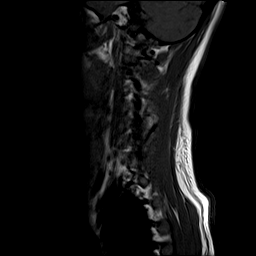
[im 6/17]
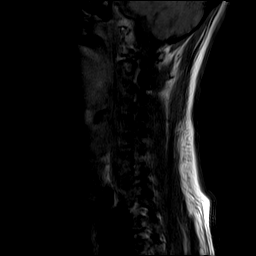
[im 9/17]
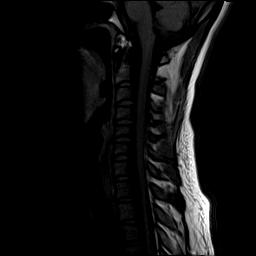
[im 11/17]
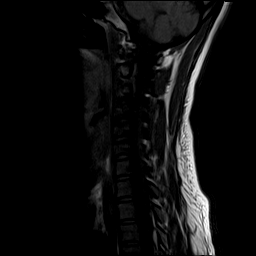
[im 14/17]
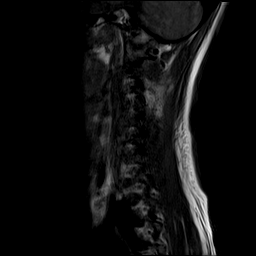
[im 17/17]
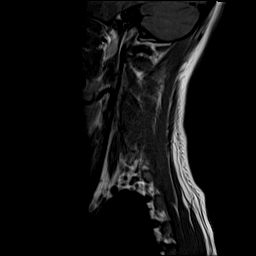

[Series 5: T2 · axial · 3.0mm · 0.70mm/px · z∈[-87,+2]mm · 8 of 25 slices shown (2 of 2)]
[im 1/25]
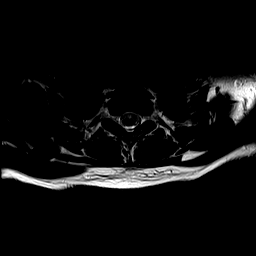
[im 3/25]
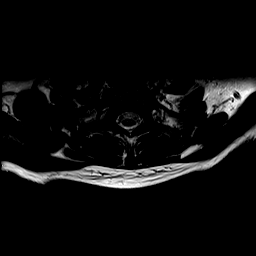
[im 9/25]
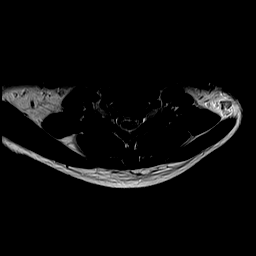
[im 11/25]
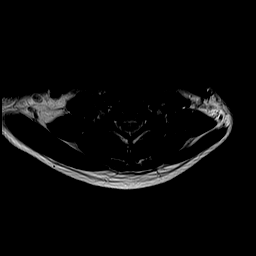
[im 14/25]
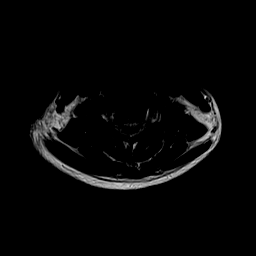
[im 17/25]
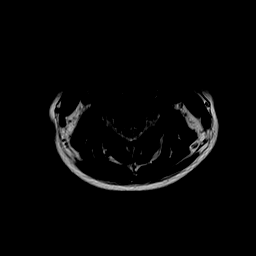
[im 22/25]
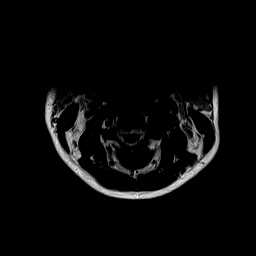
[im 25/25]
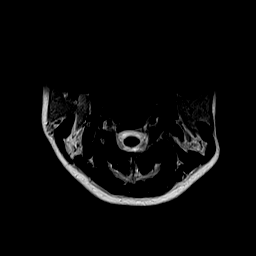

[Series 6: GRE · axial · 3.0mm · 0.35mm/px · z∈[-87,-58]mm · 3 of 25 slices shown]
[im 1/25]
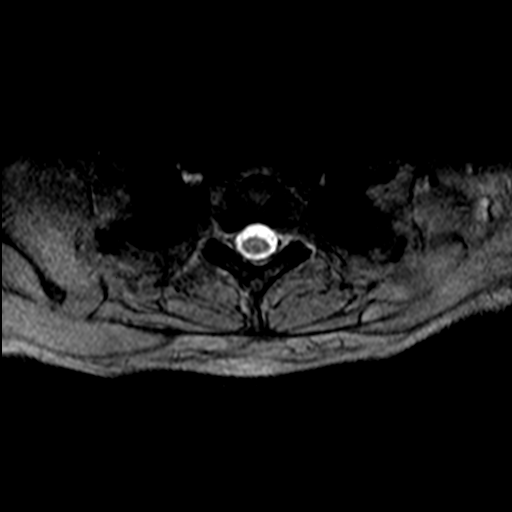
[im 3/25]
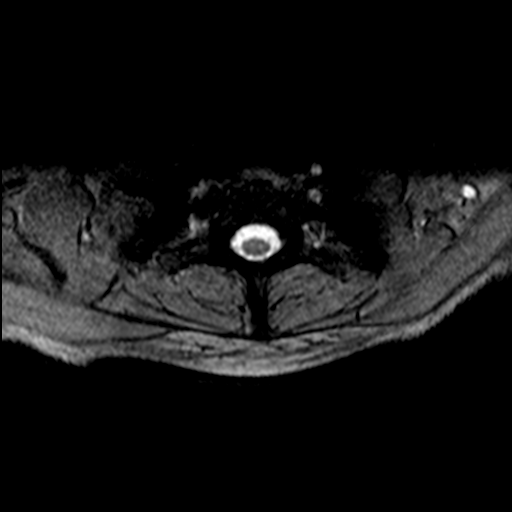
[im 9/25]
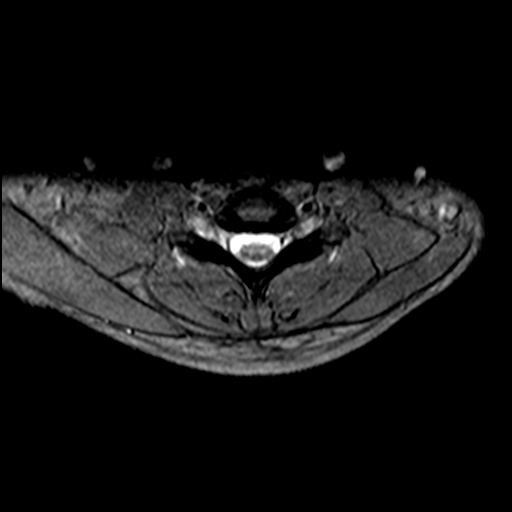

[Series 7: T1 · sagittal · 3.0mm · 0.82mm/px · 7 of 17 slices shown (2 of 2)]
[im 1/17]
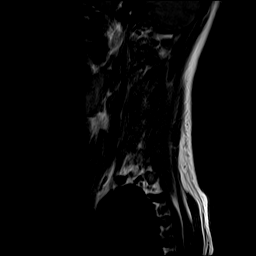
[im 3/17]
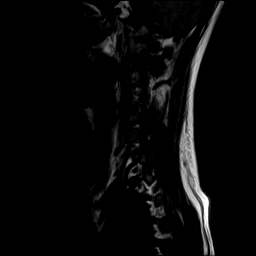
[im 6/17]
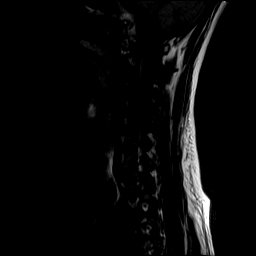
[im 9/17]
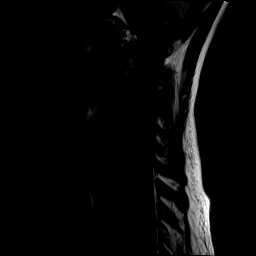
[im 11/17]
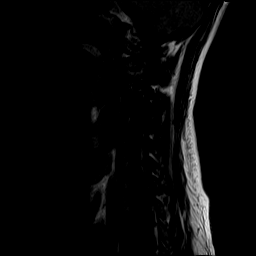
[im 14/17]
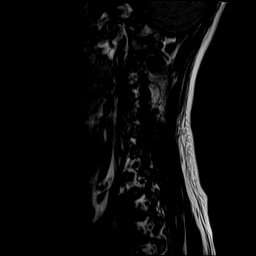
[im 17/17]
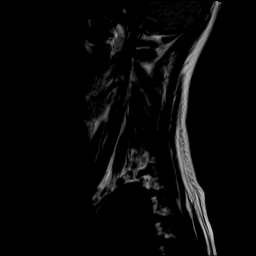

[39 of 48 positions shown; findings below may reference images not displayed]

FINDINGS: Alignment: Straightening of the cervical curvature.

Vertebrae: No fracture, evidence of discitis, or bone lesion.

Cord: Evaluation is partially limited by motion artifacts. No gross
cord signal abnormality.

Posterior Fossa, vertebral arteries, paraspinal tissues: Negative.

Disc levels:

Minimal disc bulges at C3-4, C4-5 and C5-6 causing small
indentations of the thecal sac.

No significant spinal canal or neural foraminal stenosis at any
level.
IMPRESSION: 1. Minimal disc bulges at C3-4, C4-5 and C5-6.
2. No spinal canal or neural foraminal stenosis at any level.

## 2021-08-13 NOTE — Progress Notes (Signed)
Pt state right hand pain. Pt state she has pain in her right middle, ring and index fingers that travels up to her wrist and elbow. Pt state relaxing and going to sleep at night makes the pain worse. Pt state she takes over the counter pain meds to help ease her pain. Pt state she is right handed. ? ?Numeric Pain Rating Scale and Functional Assessment ?Average Pain 8 ? ? ?In the last MONTH (on 0-10 scale) has pain interfered with the following? ? ?1. General activity like being  able to carry out your everyday physical activities such as walking, climbing stairs, carrying groceries, or moving a chair?  ?Rating(9) ? ? ? ? ?

## 2021-08-13 NOTE — Progress Notes (Signed)
? ?Alice Velez - 26 y.o. female MRN [id number]  Date of birth: 1995-10-22 ? ?Office Visit Note: ?Visit Date: 08/13/2021 ?PCP: Pcp, No ?Referred by: Naida Sleight, PA-C ? ?Subjective: ?Chief Complaint  ?Patient presents with  ? Right Hand - Pain  ? Right Wrist - Pain  ? Right Elbow - Pain  ? ?HPI:  Alice Velez is a 26 y.o. female who comes in today at the request of Dr. Zonia Kief for electrodiagnostic study of the Right upper extremities.  Patient is Right hand dominant.  She reports chronic worsening severe right hand pain with symptoms into the middle 2 digits.  She reports the symptoms do seem to refer up the arm to the elbow.  It is worse at night trying to go to sleep.  She endorses some neck pain at times.  She has an MRI of the cervical spine pending.  She denies any left-sided symptoms.  She has had no prior electrodiagnostic studies to compare. ? ?ROS Otherwise per HPI. ? ?Assessment & Plan: ?Visit Diagnoses:  ?  ICD-10-CM   ?1. Paresthesia of skin  R20.2 NCV with EMG (electromyography)  ?  ?  ?Plan:Impression: ?Essentially NORMAL electrodiagnostic study of the right upper limb.  There is no significant electrodiagnostic evidence of nerve entrapment, brachial plexopathy or cervical radiculopathy.   ? ?As you know, purely sensory or demyelinating radiculopathies and chemical radiculitis may not be detected with this particular electrodiagnostic study. **This electrodiagnostic study cannot rule out small fiber polyneuropathy and dysesthesias from central pain syndromes such as stroke or central pain sensitization syndromes such as fibromyalgia.  Myotomal referral pain from trigger points is also not excluded. ? ?Recommendations: ?1.  Follow-up with referring physician. ?2.  Continue current management of symptoms. ? ?Meds & Orders: No orders of the defined types were placed in this encounter. ?  ?Orders Placed This Encounter  ?Procedures  ? NCV with EMG (electromyography)  ?  ?Follow-up:  Return in about 2 weeks (around 08/27/2021) for Zonia Kief, PA-C.  ? ?Procedures: ?No procedures performed  ?EMG & NCV Findings: ?All nerve conduction studies (as indicated in the following tables) were within normal limits.   ? ?All examined muscles (as indicated in the following table) showed no evidence of electrical instability.   ? ?Impression: ?Essentially NORMAL electrodiagnostic study of the right upper limb.  There is no significant electrodiagnostic evidence of nerve entrapment, brachial plexopathy or cervical radiculopathy.   ? ?As you know, purely sensory or demyelinating radiculopathies and chemical radiculitis may not be detected with this particular electrodiagnostic study. **This electrodiagnostic study cannot rule out small fiber polyneuropathy and dysesthesias from central pain syndromes such as stroke or central pain sensitization syndromes such as fibromyalgia.  Myotomal referral pain from trigger points is also not excluded. ? ?Recommendations: ?1.  Follow-up with referring physician. ?2.  Continue current management of symptoms. ? ?___________________________ ?Naaman Plummer FAAPMR ?Board Certified, Biomedical engineer of Physical Medicine and Rehabilitation ? ? ? ?Nerve Conduction Studies ?Anti Sensory Summary Table ? ? Stim Site NR Peak (ms) Norm Peak (ms) P-T Amp (?V) Norm P-T Amp Site1 Site2 Delta-P (ms) Dist (cm) Vel (m/s) Norm Vel (m/s)  ?Right Median Acr Palm Anti Sensory (2nd Digit)  30.4?C  ?Wrist    3.1 <3.6 11.3 >10 Wrist Palm 1.4 0.0    ?Palm    1.7 <2.0 53.2         ?Right Radial Anti Sensory (Base 1st Digit)  30.5?C  ?Wrist  2.1 <3.1 31.0  Wrist Base 1st Digit 2.1 0.0    ?Right Ulnar Anti Sensory (5th Digit)  30.7?C  ?Wrist    3.1 <3.7 36.2 >15.0 Wrist 5th Digit 3.1 14.0 45 >38  ? ?Motor Summary Table ? ? Stim Site NR Onset (ms) Norm Onset (ms) O-P Amp (mV) Norm O-P Amp Site1 Site2 Delta-0 (ms) Dist (cm) Vel (m/s) Norm Vel (m/s)  ?Right Median Motor (Abd Poll Brev)  30.7?C  ?Wrist    3.4  <4.2 5.5 >5 Elbow Wrist 3.8 19.0 50 >50  ?Elbow    7.2  7.0         ?Right Ulnar Motor (Abd Dig Min)  31?C  ?Wrist    2.7 <4.2 9.4 >3 B Elbow Wrist 3.4 18.5 54 >53  ?B Elbow    6.1  10.0  A Elbow B Elbow 1.2 10.0 83 >53  ?A Elbow    7.3  10.0         ? ?EMG ? ? Side Muscle Nerve Root Ins Act Fibs Psw Amp Dur Poly Recrt Int Dennie Bible Comment  ?Right Abd Poll Brev Median C8-T1 Nml Nml Nml Nml Nml 0 Nml Nml   ?Right 1stDorInt Ulnar C8-T1 Nml Nml Nml Nml Nml 0 Nml Nml   ?Right PronatorTeres Median C6-7 Nml Nml Nml Nml Nml 0 Nml Nml   ?Right Biceps Musculocut C5-6 Nml Nml Nml Nml Nml 0 Nml Nml   ?Right Deltoid Axillary C5-6 Nml Nml Nml Nml Nml 0 Nml Nml   ? ? ?Nerve Conduction Studies ?Anti Sensory Left/Right Comparison ? ? Stim Site L Lat (ms) R Lat (ms) L-R Lat (ms) L Amp (?V) R Amp (?V) L-R Amp (%) Site1 Site2 L Vel (m/s) R Vel (m/s) L-R Vel (m/s)  ?Median Acr Palm Anti Sensory (2nd Digit)  30.4?C  ?Wrist  3.1   11.3  Wrist Palm     ?Palm  1.7   53.2        ?Radial Anti Sensory (Base 1st Digit)  30.5?C  ?Wrist  2.1   31.0  Wrist Base 1st Digit     ?Ulnar Anti Sensory (5th Digit)  30.7?C  ?Wrist  3.1   36.2  Wrist 5th Digit  45   ? ?Motor Left/Right Comparison ? ? Stim Site L Lat (ms) R Lat (ms) L-R Lat (ms) L Amp (mV) R Amp (mV) L-R Amp (%) Site1 Site2 L Vel (m/s) R Vel (m/s) L-R Vel (m/s)  ?Median Motor (Abd Poll Brev)  30.7?C  ?Wrist  3.4   5.5  Elbow Wrist  50   ?Elbow  7.2   7.0        ?Ulnar Motor (Abd Dig Min)  31?C  ?Wrist  2.7   9.4  B Elbow Wrist  54   ?B Elbow  6.1   10.0  A Elbow B Elbow  83   ?A Elbow  7.3   10.0        ? ? ? ?Waveforms: ?    ? ?   ? ?  ? ?Clinical History: ?No specialty comments available.  ? ? ? ?Objective:  VS:  HT:    WT:   BMI:     BP:   HR: bpm  TEMP: ( )  RESP:  ?Physical Exam  ? ?Imaging: ?No results found. ?

## 2021-08-14 NOTE — Procedures (Signed)
EMG & NCV Findings: ?All nerve conduction studies (as indicated in the following tables) were within normal limits.   ? ?All examined muscles (as indicated in the following table) showed no evidence of electrical instability.   ? ?Impression: ?Essentially NORMAL electrodiagnostic study of the right upper limb.  There is no significant electrodiagnostic evidence of nerve entrapment, brachial plexopathy or cervical radiculopathy.   ? ?As you know, purely sensory or demyelinating radiculopathies and chemical radiculitis may not be detected with this particular electrodiagnostic study. **This electrodiagnostic study cannot rule out small fiber polyneuropathy and dysesthesias from central pain syndromes such as stroke or central pain sensitization syndromes such as fibromyalgia.  Myotomal referral pain from trigger points is also not excluded. ? ?Recommendations: ?1.  Follow-up with referring physician. ?2.  Continue current management of symptoms. ? ?___________________________ ?Naaman Plummer FAAPMR ?Board Certified, Biomedical engineer of Physical Medicine and Rehabilitation ? ? ? ?Nerve Conduction Studies ?Anti Sensory Summary Table ? ? Stim Site NR Peak (ms) Norm Peak (ms) P-T Amp (?V) Norm P-T Amp Site1 Site2 Delta-P (ms) Dist (cm) Vel (m/s) Norm Vel (m/s)  ?Right Median Acr Palm Anti Sensory (2nd Digit)  30.4?C  ?Wrist    3.1 <3.6 11.3 >10 Wrist Palm 1.4 0.0    ?Palm    1.7 <2.0 53.2         ?Right Radial Anti Sensory (Base 1st Digit)  30.5?C  ?Wrist    2.1 <3.1 31.0  Wrist Base 1st Digit 2.1 0.0    ?Right Ulnar Anti Sensory (5th Digit)  30.7?C  ?Wrist    3.1 <3.7 36.2 >15.0 Wrist 5th Digit 3.1 14.0 45 >38  ? ?Motor Summary Table ? ? Stim Site NR Onset (ms) Norm Onset (ms) O-P Amp (mV) Norm O-P Amp Site1 Site2 Delta-0 (ms) Dist (cm) Vel (m/s) Norm Vel (m/s)  ?Right Median Motor (Abd Poll Brev)  30.7?C  ?Wrist    3.4 <4.2 5.5 >5 Elbow Wrist 3.8 19.0 50 >50  ?Elbow    7.2  7.0         ?Right Ulnar Motor (Abd Dig Min)  31?C   ?Wrist    2.7 <4.2 9.4 >3 B Elbow Wrist 3.4 18.5 54 >53  ?B Elbow    6.1  10.0  A Elbow B Elbow 1.2 10.0 83 >53  ?A Elbow    7.3  10.0         ? ?EMG ? ? Side Muscle Nerve Root Ins Act Fibs Psw Amp Dur Poly Recrt Int Dennie Bible Comment  ?Right Abd Poll Brev Median C8-T1 Nml Nml Nml Nml Nml 0 Nml Nml   ?Right 1stDorInt Ulnar C8-T1 Nml Nml Nml Nml Nml 0 Nml Nml   ?Right PronatorTeres Median C6-7 Nml Nml Nml Nml Nml 0 Nml Nml   ?Right Biceps Musculocut C5-6 Nml Nml Nml Nml Nml 0 Nml Nml   ?Right Deltoid Axillary C5-6 Nml Nml Nml Nml Nml 0 Nml Nml   ? ? ?Nerve Conduction Studies ?Anti Sensory Left/Right Comparison ? ? Stim Site L Lat (ms) R Lat (ms) L-R Lat (ms) L Amp (?V) R Amp (?V) L-R Amp (%) Site1 Site2 L Vel (m/s) R Vel (m/s) L-R Vel (m/s)  ?Median Acr Palm Anti Sensory (2nd Digit)  30.4?C  ?Wrist  3.1   11.3  Wrist Palm     ?Palm  1.7   53.2        ?Radial Anti Sensory (Base 1st Digit)  30.5?C  ?Wrist  2.1  31.0  Wrist Base 1st Digit     ?Ulnar Anti Sensory (5th Digit)  30.7?C  ?Wrist  3.1   36.2  Wrist 5th Digit  45   ? ?Motor Left/Right Comparison ? ? Stim Site L Lat (ms) R Lat (ms) L-R Lat (ms) L Amp (mV) R Amp (mV) L-R Amp (%) Site1 Site2 L Vel (m/s) R Vel (m/s) L-R Vel (m/s)  ?Median Motor (Abd Poll Brev)  30.7?C  ?Wrist  3.4   5.5  Elbow Wrist  50   ?Elbow  7.2   7.0        ?Ulnar Motor (Abd Dig Min)  31?C  ?Wrist  2.7   9.4  B Elbow Wrist  54   ?B Elbow  6.1   10.0  A Elbow B Elbow  83   ?A Elbow  7.3   10.0        ? ? ? ?Waveforms: ?    ? ?   ? ? ?

## 2021-08-26 ENCOUNTER — Telehealth: Payer: Self-pay | Admitting: Surgery

## 2021-08-26 NOTE — Telephone Encounter (Signed)
Patient called asked if anyone spoke spanish. I tried to help the patient but she couldn't understand me.  Patient said she has a question.  Can you please call patient for me?  ?

## 2021-08-27 ENCOUNTER — Ambulatory Visit: Payer: Self-pay | Admitting: Surgery

## 2021-08-27 NOTE — Telephone Encounter (Signed)
Called patient no answer LMOM.

## 2021-08-28 ENCOUNTER — Ambulatory Visit: Payer: Self-pay | Admitting: Surgery

## 2021-09-10 ENCOUNTER — Ambulatory Visit: Payer: Self-pay | Admitting: Surgery

## 2021-11-13 ENCOUNTER — Ambulatory Visit: Payer: No Typology Code available for payment source | Admitting: Specialist

## 2022-08-23 ENCOUNTER — Emergency Department (HOSPITAL_COMMUNITY)
Admission: EM | Admit: 2022-08-23 | Discharge: 2022-08-23 | Disposition: A | Discharge status: Home / Self Care | Payer: No Typology Code available for payment source | Attending: Emergency Medicine | Admitting: Emergency Medicine

## 2022-08-23 ENCOUNTER — Emergency Department (HOSPITAL_COMMUNITY): Payer: No Typology Code available for payment source

## 2022-08-23 DIAGNOSIS — N12 Tubulo-interstitial nephritis, not specified as acute or chronic: Secondary | ICD-10-CM

## 2022-08-23 DIAGNOSIS — R000 Tachycardia, unspecified: Secondary | ICD-10-CM | POA: Insufficient documentation

## 2022-08-23 LAB — CBC WITH DIFFERENTIAL/PLATELET
Abs Immature Granulocytes: 0.09 10*3/uL — ABNORMAL HIGH (ref 0.00–0.07)
Basophils Absolute: 0 10*3/uL (ref 0.0–0.1)
Basophils Relative: 0 %
Eosinophils Absolute: 0 10*3/uL (ref 0.0–0.5)
Eosinophils Relative: 0 %
HCT: 41 % (ref 36.0–46.0)
Hemoglobin: 13.9 g/dL (ref 12.0–15.0)
Immature Granulocytes: 1 %
Lymphocytes Relative: 3 %
Lymphs Abs: 0.6 10*3/uL — ABNORMAL LOW (ref 0.7–4.0)
MCH: 29.4 pg (ref 26.0–34.0)
MCHC: 33.9 g/dL (ref 30.0–36.0)
MCV: 86.9 fL (ref 80.0–100.0)
Monocytes Absolute: 1 10*3/uL (ref 0.1–1.0)
Monocytes Relative: 5 %
Neutro Abs: 16.6 10*3/uL — ABNORMAL HIGH (ref 1.7–7.7)
Neutrophils Relative %: 91 %
Platelets: 290 10*3/uL (ref 150–400)
RBC: 4.72 MIL/uL (ref 3.87–5.11)
RDW: 13.3 % (ref 11.5–15.5)
WBC: 18.2 10*3/uL — ABNORMAL HIGH (ref 4.0–10.5)
nRBC: 0 % (ref 0.0–0.2)

## 2022-08-23 LAB — I-STAT BETA HCG BLOOD, ED (MC, WL, AP ONLY): I-stat hCG, quantitative: <5 m[IU]/mL (ref <5)

## 2022-08-23 LAB — TSH: TSH: 0.501 u[IU]/mL (ref 0.350–4.500)

## 2022-08-23 LAB — COMPREHENSIVE METABOLIC PANEL
ALT: 28 U/L (ref 0–44)
AST: 21 U/L (ref 15–41)
Albumin: 3.7 g/dL (ref 3.5–5.0)
Alkaline Phosphatase: 63 U/L (ref 38–126)
Anion gap: 12 (ref 5–15)
BUN: 9 mg/dL (ref 6–20)
CO2: 21 mmol/L — ABNORMAL LOW (ref 22–32)
Calcium: 9.7 mg/dL (ref 8.9–10.3)
Chloride: 100 mmol/L (ref 98–111)
Creatinine, Ser: 0.7 mg/dL (ref 0.44–1.00)
GFR, Estimated: >60 mL/min (ref >60)
Glucose, Bld: 240 mg/dL — ABNORMAL HIGH (ref 70–99)
Potassium: 3.5 mmol/L (ref 3.5–5.1)
Sodium: 133 mmol/L — ABNORMAL LOW (ref 135–145)
Total Bilirubin: 0.9 mg/dL (ref 0.3–1.2)
Total Protein: 7 g/dL (ref 6.5–8.1)

## 2022-08-23 LAB — URINALYSIS, ROUTINE W REFLEX MICROSCOPIC
Bilirubin Urine: NEGATIVE
Glucose, UA: >=500 mg/dL — AB
Ketones, ur: NEGATIVE mg/dL
Nitrite: NEGATIVE
Protein, ur: 30 mg/dL — AB
Specific Gravity, Urine: 1 — ABNORMAL LOW (ref 1.005–1.030)
pH: 6 (ref 5.0–8.0)

## 2022-08-23 LAB — LACTIC ACID, PLASMA
Lactic Acid, Venous: 2.9 mmol/L (ref 0.5–1.9)
Lactic Acid, Venous: 1.9 mmol/L (ref 0.5–1.9)

## 2022-08-23 LAB — LIPASE, BLOOD: Lipase: 35 U/L (ref 11–51)

## 2022-08-23 MED ORDER — ONDANSETRON 4 MG PO TBDP: 4.0000 mg | ORAL_TABLET | Freq: Three times a day (TID) | ORAL | 0 refills | Status: DC | PRN

## 2022-08-23 MED ORDER — SODIUM CHLORIDE 0.9 % IV BOLUS
1000.0000 mL | Freq: Once | INTRAVENOUS | Status: AC
  Administered 2022-08-23: 1000 mL via INTRAVENOUS

## 2022-08-23 MED ORDER — HYDROCODONE-ACETAMINOPHEN 5-325 MG PO TABS: 1.0000 | ORAL_TABLET | Freq: Four times a day (QID) | ORAL | 0 refills | Status: DC | PRN

## 2022-08-23 MED ORDER — ONDANSETRON HCL 4 MG/2ML IJ SOLN
4.0000 mg | Freq: Once | INTRAMUSCULAR | Status: AC
  Administered 2022-08-23: 4 mg via INTRAVENOUS
  Filled 2022-08-23: qty 2

## 2022-08-23 MED ORDER — CEFDINIR 300 MG PO CAPS: 300.0000 mg | ORAL_CAPSULE | Freq: Two times a day (BID) | ORAL | 0 refills | Status: DC

## 2022-08-23 MED ORDER — KETOROLAC TROMETHAMINE 15 MG/ML IJ SOLN
15.0000 mg | Freq: Once | INTRAMUSCULAR | Status: AC
  Administered 2022-08-23: 15 mg via INTRAVENOUS
  Filled 2022-08-23: qty 1

## 2022-08-23 MED ORDER — SODIUM CHLORIDE 0.9 % IV SOLN
1.0000 g | Freq: Once | INTRAVENOUS | Status: AC
  Administered 2022-08-23: 1 g via INTRAVENOUS
  Filled 2022-08-23: qty 10

## 2022-08-23 MED ORDER — MORPHINE SULFATE (PF) 4 MG/ML IV SOLN
4.0000 mg | Freq: Once | INTRAVENOUS | Status: AC
  Administered 2022-08-23: 4 mg via INTRAVENOUS
  Filled 2022-08-23: qty 1

## 2022-08-23 MED ORDER — ACETAMINOPHEN 325 MG PO TABS
650.0000 mg | ORAL_TABLET | Freq: Once | ORAL | Status: AC
  Administered 2022-08-23: 650 mg via ORAL
  Filled 2022-08-23: qty 2

## 2022-08-23 MED ORDER — IBUPROFEN 400 MG PO TABS
600.0000 mg | ORAL_TABLET | Freq: Once | ORAL | Status: AC
  Administered 2022-08-23: 600 mg via ORAL
  Filled 2022-08-23: qty 1

## 2022-08-23 MED ORDER — IOPAMIDOL (ISOVUE-370) INJECTION 76%
75.0000 mL | Freq: Once | INTRAVENOUS | Status: AC | PRN
  Administered 2022-08-23: 75 mL via INTRAVENOUS

## 2022-08-23 MED ORDER — ACETAMINOPHEN 500 MG PO TABS
1000.0000 mg | ORAL_TABLET | Freq: Once | ORAL | Status: AC
  Administered 2022-08-23: 1000 mg via ORAL
  Filled 2022-08-23: qty 2

## 2022-08-23 NOTE — ED Triage Notes (Signed)
Patient with complaint of RLQ abdominal pain and right lower back pain that started yesterday. Denies history of abdominal surgeries. Patient is alert, oriented, and in no apparent distress at this time.

## 2022-08-23 NOTE — Discharge Instructions (Addendum)
Please read and follow all provided instructions.  Your diagnoses today include:  1. Pyelonephritis    Tests performed today include: Complete blood cell count: Slightly elevated infection fighting cell count Complete metabolic panel: No problems Lipase (pancreas function test): No problems Urinalysis (urine test): Chest possible urine infection Pregnancy test (urine or blood, in women only): Negative CT scan of the abdomen pelvis: Does not show any serious problems Vital signs. See below for your results today.   Medications prescribed:  Cefdinir: Antibiotic for kidney infection  Zofran (ondansetron) - for nausea and vomiting  Vicodin (hydrocodone/acetaminophen) - narcotic pain medication  DO NOT drive or perform any activities that require you to be awake and alert because this medicine can make you drowsy. BE VERY CAREFUL not to take multiple medicines containing Tylenol (also called acetaminophen). Doing so can lead to an overdose which can damage your liver and cause liver failure and possibly death.  Take any prescribed medications only as directed.  Home care instructions:  Follow any educational materials contained in this packet.  BE VERY CAREFUL not to take multiple medicines containing Tylenol (also called acetaminophen). Doing so can lead to an overdose which can damage your liver and cause liver failure and possibly death.   Follow-up instructions: Please follow-up with your primary care provider in the next 3 days for further evaluation of your symptoms.   Return instructions:  Please return to the Emergency Department if you experience worsening symptoms.  Please return if you have any other emergent concerns.  Additional Information:  Your vital signs today were: BP (!) 90/54   Pulse (!) 106   Temp 98.7 F (37.1 C)   Resp 20   SpO2 99%  If your blood pressure (BP) was elevated above 135/85 this visit, please have this repeated by your doctor within one  month. --------------

## 2022-08-23 NOTE — ED Provider Triage Note (Signed)
Emergency Medicine Provider Triage Evaluation Note  Alice Velez , a 27 y.o. female  was evaluated in triage.  Pt complains of RLQ abdominal pain  and right lower back pain onset yesterday. No abdominal surgery history. No history of UTI or kidney stones. Has nausea and vomiting. No urinary symptoms. .  Review of Systems  Positive:  Negative:   Physical Exam  BP 129/80 (BP Location: Left Arm)   Pulse (!) 142   Temp 99.1 F (37.3 C)   Resp 18   SpO2 96%  Gen:   Awake, no distress, tachycardic to 144 in triage Resp:  Normal effort  MSK:   Moves extremities without difficulty  Other:  Diffuse abdominal TTP  Medical Decision Making  Medically screening exam initiated at 4:17 PM.  Appropriate orders placed.  Alice Velez was informed that the remainder of the evaluation will be completed by another provider, this initial triage assessment does not replace that evaluation, and the importance of remaining in the ED until their evaluation is complete.  Discussed with RN that patient is in need of a room immediately. RN aware and working on room placement.    Kaydyn Sayas A, PA-C 08/23/22 2011

## 2022-08-23 NOTE — ED Provider Notes (Signed)
[facility] Provider Note   CSN: [id number] Arrival date & time: 08/23/22  1606     History  Chief Complaint  Patient presents with   Abdominal Pain    Alice Velez is a 27 y.o. female.  Patient with history of C-section, history of urinary tract infection --presents to the emergency room today for evaluation of abdominal pain, back pain and vomiting starting yesterday.  Patient reports temperature to 99 F at home.  She has been taking Tylenol, last dose was this morning.  No chest pain or shortness of breath.  She denies vaginal bleeding or discharge.  No diarrhea.  She has had mild dysuria and increased frequency with urination.       Home Medications Prior to Admission medications   Medication Sig Start Date End Date Taking? Authorizing Provider  cephALEXin (KEFLEX) 500 MG capsule Take 1 capsule (500 mg total) by mouth 3 (three) times daily. 09/17/16   Virgel Manifold, MD  ferrous sulfate (FERROUSUL) 325 (65 FE) MG tablet Take 1 tablet (325 mg total) by mouth daily with breakfast. 02/26/16   Steve Rattler, DO  ibuprofen (ADVIL,MOTRIN) 400 MG tablet Take 1 tablet (400 mg total) by mouth every 6 (six) hours as needed. 09/17/16   Virgel Manifold, MD  ibuprofen (ADVIL,MOTRIN) 600 MG tablet Take 1 tablet (600 mg total) by mouth every 6 (six) hours. 02/26/16   Steve Rattler, DO  metFORMIN (GLUCOPHAGE) 1000 MG tablet Take 1 tablet (1,000 mg total) by mouth 2 (two) times daily with a meal. 02/26/16   Riccio, Angela C, DO  ondansetron (ZOFRAN) 8 MG tablet Take 1 tablet (8 mg total) by mouth every 8 (eight) hours as needed for nausea or vomiting. 09/15/16   Street, [place], PA-C  oxyCODONE-acetaminophen (PERCOCET/ROXICET) 5-325 MG tablet Take 1 tablet by mouth every 4 (four) hours as needed for severe pain. 09/17/16   Virgel Manifold, MD  Prenatal Vit-Fe Fumarate-FA (PRENATAL MULTIVITAMIN) TABS tablet Take 1 tablet by mouth daily at 12 noon.     [provider]  ranitidine (ZANTAC) 150 MG tablet Take 1 tablet (150 mg total) by mouth 2 (two) times daily. 12/23/15   Leftwich-Kirby, Kathie Dike, CNM  senna-docusate (SENOKOT-S) 8.6-50 MG tablet Take 2 tablets by mouth daily. 02/27/16   Steve Rattler, DO      Allergies    Patient has no known allergies.    Review of Systems   Review of Systems  Physical Exam Updated Vital Signs BP 129/80 (BP Location: Left Arm)   Pulse (!) 142   Temp 99.1 F (37.3 C)   Resp 18   SpO2 96%  Physical Exam Vitals and nursing note reviewed.  Constitutional:      General: She is not in acute distress.    Appearance: She is well-developed.  HENT:     Head: Normocephalic and atraumatic.     Right Ear: External ear normal.     Left Ear: External ear normal.     Nose: Nose normal.  Eyes:     Conjunctiva/sclera: Conjunctivae normal.  Cardiovascular:     Rate and Rhythm: Regular rhythm. Tachycardia present.     Heart sounds: No murmur heard.    Comments: Heart rate around 110 during my exam Pulmonary:     Effort: No respiratory distress.     Breath sounds: No wheezing, rhonchi or rales.  Abdominal:     General: There is no distension.  Palpations: Abdomen is soft.     Tenderness: There is abdominal tenderness in the right upper quadrant, periumbilical area and suprapubic area. There is no guarding or rebound. Negative signs include Murphy's sign and McBurney's sign.  Musculoskeletal:     Cervical back: Normal range of motion and neck supple.     Right lower leg: No edema.     Left lower leg: No edema.  Skin:    General: Skin is warm and dry.     Findings: No rash.  Neurological:     General: No focal deficit present.     Mental Status: She is alert. Mental status is at baseline.     Motor: No weakness.  Psychiatric:        Mood and Affect: Mood normal.     ED Results / Procedures / Treatments   Labs (all labs ordered are listed, but only abnormal results are  displayed) Labs Reviewed  CBC WITH DIFFERENTIAL/PLATELET - Abnormal; Notable for the following components:      Result Value   WBC 18.2 (*)    Neutro Abs 16.6 (*)    Lymphs Abs 0.6 (*)    Abs Immature Granulocytes 0.09 (*)    All other components within normal limits  COMPREHENSIVE METABOLIC PANEL - Abnormal; Notable for the following components:   Sodium 133 (*)    CO2 21 (*)    Glucose, Bld 240 (*)    All other components within normal limits  URINALYSIS, ROUTINE W REFLEX MICROSCOPIC - Abnormal; Notable for the following components:   Color, Urine STRAW (*)    Specific Gravity, Urine 1.000 (*)    Glucose, UA >=500 (*)    Hgb urine dipstick MODERATE (*)    Protein, ur 30 (*)    Leukocytes,Ua SMALL (*)    Bacteria, UA RARE (*)    All other components within normal limits  LACTIC ACID, PLASMA - Abnormal; Notable for the following components:   Lactic Acid, Venous 2.9 (*)    All other components within normal limits  URINE CULTURE  LIPASE, BLOOD  TSH  LACTIC ACID, PLASMA  I-STAT BETA HCG BLOOD, ED ([facility], [facility], [facility] ONLY)    EKG None  Radiology CT ABDOMEN PELVIS W CONTRAST  Result Date: 08/23/2022 CLINICAL DATA:  Abdominal/flank pain. Right lower quadrant pain. Stone suspected. EXAM: CT ABDOMEN AND PELVIS WITH CONTRAST TECHNIQUE: Multidetector CT imaging of the abdomen and pelvis was performed using the standard protocol following bolus administration of intravenous contrast. RADIATION DOSE REDUCTION: This exam was performed according to the departmental dose-optimization program which includes automated exposure control, adjustment of the mA and/or kV according to patient size and/or use of iterative reconstruction technique. CONTRAST:  68mL ISOVUE-370 IOPAMIDOL (ISOVUE-370) INJECTION 76% COMPARISON:  None Available. FINDINGS: Lower chest: Normal Hepatobiliary: Liver parenchyma is normal.  No calcified gallstones. Pancreas: Normal Spleen: Normal Adrenals/Urinary Tract: Adrenal glands  are normal. Kidneys are normal. No cyst, mass, stone or hydronephrosis. Bladder is full but normal. Stomach/Bowel: Stomach and small intestine are normal. I do not identify the appendix but do not see inflammatory change in the region of the cecal tip. Moderate amount of stool in the colon, but within the range of normal is a. No acute colon finding. Vascular/Lymphatic: Aorta and IVC are normal.  No adenopathy. Reproductive: The uterus and adnexal regions appear normal. No pelvic mass or evidence of pelvic inflammatory change. Other: No free fluid or air. Musculoskeletal: Normal IMPRESSION: 1. No abnormality seen to explain the clinical  presentation. No evidence of urinary tract stone disease or other urinary tract pathology. 2. I do not identify the appendix but do not see inflammatory change in the region of the cecal tip. 3. Moderate amount of stool in the colon, but within the range of normal. Electronically Signed   By: Nelson Chimes M.D.   On: 08/23/2022 18:53    Procedures Procedures    Medications Ordered in ED Medications  acetaminophen (TYLENOL) tablet 1,000 mg (has no administration in time range)  sodium chloride 0.9 % bolus 1,000 mL (0 mLs Intravenous Stopped 08/23/22 1824)  acetaminophen (TYLENOL) tablet 650 mg (650 mg Oral Given 08/23/22 1708)  morphine (PF) 4 MG/ML injection 4 mg (4 mg Intravenous Given 08/23/22 1708)  ondansetron (ZOFRAN) injection 4 mg (4 mg Intravenous Given 08/23/22 1707)  iopamidol (ISOVUE-370) 76 % injection 75 mL (75 mLs Intravenous Contrast Given 08/23/22 1834)  cefTRIAXone (ROCEPHIN) 1 g in sodium chloride 0.9 % 100 mL IVPB (0 g Intravenous Stopped 08/23/22 1944)  sodium chloride 0.9 % bolus 1,000 mL (0 mLs Intravenous Stopped 08/23/22 2101)  ketorolac (TORADOL) 15 MG/ML injection 15 mg (15 mg Intravenous Given 08/23/22 1956)  ibuprofen (ADVIL) tablet 600 mg (600 mg Oral Given 08/23/22 2032)    ED Course/ Medical Decision Making/ A&P    Patient seen and  examined. History obtained directly from patient.   Labs/EKG: Ordered CBC, CMP, lipase, UA, pregnancy, TSH ordered in triage..  Imaging: Ordered CT abdomen pelvis ordered in triage.  Symptoms may be UTI or pyelonephritis, however she is significantly tender in the right upper quadrant as well so will evaluate gallbladder.  Medications/Fluids: Ordered: Morphine, Zofran, IV fluid bolus.   Most recent vital signs reviewed and are as follows: BP 129/80 (BP Location: Left Arm)   Pulse (!) 142   Temp 99.1 F (37.3 C)   Resp 18   SpO2 96%   Initial impression: Abdominal pain, flank pain  7:31 PM Reassessment performed. Patient continues to look well, but appears to be having some rigors at the current time.  Heart rate is back up into the 130s.  Labs personally reviewed and interpreted including: CBC WBC 18.2 otherwise unremarkable; CMP glucose 240 with normal anion gap, creatinine normal; lipase 35 and normal; TSH normal; UA clean-catch with 6-10 white blood cells per high-power field, negative nitrite, not overwhelming for infection, however at this time I have no other significant cause which would explain the symptoms of the patient.  Imaging personally visualized and interpreted including: CT abdomen pelvis, agree no acute findings.  Reviewed pertinent lab work and imaging with patient at bedside. Questions answered.   Most current vital signs reviewed and are as follows: BP 116/68   Pulse (!) 124   Temp 99.1 F (37.3 C)   Resp (!) 26   SpO2 96%   Plan: Check temperature, additional IV fluids, will check lactate.  Patient is currently receiving Rocephin for treatment of UTI.  9:25 PM notified of elevated lactate at 2.9.  Patient last received Tylenol 4 hours ago and will give another dose.  Will reassess.  Heart rate remains elevated.  11:14 PM Reassessment performed. Patient appears well.  She is sitting up on the edge of the bed, appears improved.  She states that she is feeling  better.  Heart rate now down into the upper 90s.  Fever improved.  Labs personally reviewed and interpreted including: Lactate 2.9 > 1.9.   Reviewed pertinent lab work and imaging with patient at bedside.  Questions answered.   Most current vital signs reviewed and are as follows: BP (!) 90/54   Pulse (!) 106   Temp 98.7 F (37.1 C)   Resp 20   SpO2 99%   Plan: Discharge to home.   Prescriptions written for: Cefdinir, Zofran, #8 Vicodin  Other home care instructions discussed: Hydration, rest  ED return instructions discussed: The patient was urged to return to the Emergency Department immediately with worsening of current symptoms, worsening abdominal pain, persistent vomiting, blood noted in stools, fever, or any other concerns. The patient verbalized understanding.   Follow-up instructions discussed: Patient encouraged to follow-up with their PCP in 3 days.                              Medical Decision Making Amount and/or Complexity of Data Reviewed Labs: ordered.  Risk OTC drugs. Prescription drug management.   For this patient's complaint of abdominal pain, the following conditions were considered on the differential diagnosis: gastritis/PUD, enteritis/duodenitis, appendicitis, cholelithiasis/cholecystitis, cholangitis, pancreatitis, ruptured viscus, colitis, diverticulitis, small/large bowel obstruction, proctitis, cystitis, pyelonephritis, ureteral colic, aortic dissection, aortic aneurysm. In women, ectopic pregnancy, pelvic inflammatory disease, ovarian cysts, and tubo-ovarian abscess were also considered. Atypical chest etiologies were also considered including ACS, PE, and pneumonia.  The patient's vital signs, pertinent lab work and imaging were reviewed and interpreted as discussed in the ED course. Hospitalization was considered for further testing, treatments, or serial exams/observation. However as patient is well-appearing, has a stable exam, and reassuring  studies today, I do not feel that they warrant admission at this time. This plan was discussed with the patient who verbalizes agreement and comfort with this plan and seems reliable and able to return to the Emergency Department with worsening or changing symptoms.          Final Clinical Impression(s) / ED Diagnoses Final diagnoses:  Pyelonephritis    Rx / DC Orders ED Discharge Orders          Ordered    cefdinir (OMNICEF) 300 MG capsule  2 times daily        08/23/22 2310    ondansetron (ZOFRAN-ODT) 4 MG disintegrating tablet  Every 8 hours PRN        08/23/22 2310    HYDROcodone-acetaminophen (NORCO/VICODIN) 5-325 MG tablet  Every 6 hours PRN        08/23/22 2310              Carlisle Cater, PA-C 08/23/22 2315    Margette Fast, MD 08/26/22 1727

## 2022-08-26 LAB — URINE CULTURE: Culture: >=100000 — AB

## 2022-08-27 ENCOUNTER — Telehealth (HOSPITAL_BASED_OUTPATIENT_CLINIC_OR_DEPARTMENT_OTHER): Payer: Self-pay | Admitting: *Deleted

## 2022-08-27 NOTE — Telephone Encounter (Signed)
Post ED Visit - Positive Culture Follow-up  Culture report reviewed by antimicrobial stewardship pharmacist: [facility] Team []  [address], Pharm.D. []  Heide Guile, Pharm.D., BCPS AQ-ID []  Parks Neptune, Pharm.D., BCPS []  Alycia Rossetti, Pharm.D., BCPS []  [place], [place].D., BCPS, AAHIVP []  Legrand Como, Pharm.D., BCPS, AAHIVP []  Salome Arnt, PharmD, BCPS []  Johnnette Gourd, PharmD, BCPS []  Hughes Better, PharmD, BCPS []  Leeroy Cha, PharmD []  Laqueta Linden, PharmD, BCPS []  Albertina Parr, PharmD  [facility] Team []  Leodis Sias, PharmD []  Lindell Spar, PharmD []  Royetta Asal, PharmD []  Graylin Shiver, Rph []  Rema Fendt) Glennon Mac, PharmD []  Arlyn Dunning, PharmD []  Netta Cedars, PharmD []  Dia Sitter, PharmD []  Leone Haven, PharmD []  Gretta Arab, PharmD []  Theodis Shove, PharmD []  Peggyann Juba, PharmD []  Reuel Boom, PharmD   Positive urine culture Treated with Cefdinir, organism sensitive to the same and no further patient follow-up is required at this time.  Billey Gosling, PharmD  Harlon Flor [facility] 08/27/2022, 9:41 AM

## 2023-08-05 ENCOUNTER — Other Ambulatory Visit: Payer: Self-pay | Admitting: Physician Assistant

## 2023-08-05 DIAGNOSIS — R1011 Right upper quadrant pain: Secondary | ICD-10-CM

## 2023-08-11 ENCOUNTER — Ambulatory Visit
Admission: RE | Admit: 2023-08-11 | Discharge: 2023-08-11 | Disposition: A | Discharge status: Home / Self Care | Source: Ambulatory Visit | Attending: Physician Assistant | Admitting: Physician Assistant

## 2023-08-11 DIAGNOSIS — R1011 Right upper quadrant pain: Secondary | ICD-10-CM

## 2023-11-01 NOTE — Progress Notes (Signed)
 Surgical Instructions   Your procedure is scheduled on Thursday, June 12th, 2025. Report to [facility] Main Entrance "A" at 5:30 A.M., then check in with the Admitting office. Any questions or running late day of surgery: call [phone]  Questions prior to your surgery date: call [phone], Monday-Friday, 8am-4pm. If you experience any cold or flu symptoms such as cough, fever, chills, shortness of breath, etc. between now and your scheduled surgery, please notify us  at the above number.     Remember:  Do not eat after midnight the night before your surgery   You may drink clear liquids until 4:30 the morning of your surgery.   Clear liquids allowed are: Water, Non-Citrus Juices (without pulp), Carbonated Beverages, Clear Tea (no milk, honey, etc.), Black Coffee Only (NO MILK, CREAM OR POWDERED CREAMER of any kind), and Gatorade.    Take these medicines the morning of surgery with A SIP OF WATER: Ranitidine  (Zantac )   May take these medicines IF NEEDED: Ondansetron  (Zofran )    One week prior to surgery, STOP taking any Aspirin  (unless otherwise instructed by your surgeon) Aleve , Naproxen , Ibuprofen , Motrin , Advil , Goody's, BC's, all herbal medications, fish oil, and non-prescription vitamins.   WHAT DO I DO ABOUT MY DIABETES MEDICATION?   Do not take Metformin  (Glucophage ) on the day of surgery.     HOW TO MANAGE YOUR DIABETES BEFORE AND AFTER SURGERY  Why is it important to control my blood sugar before and after surgery? Improving blood sugar levels before and after surgery helps healing and can limit problems. A way of improving blood sugar control is eating a healthy diet by:  Eating less sugar and carbohydrates  Increasing activity/exercise  Talking with your doctor about reaching your blood sugar goals High blood sugars (greater than 180 mg/dL) can raise your risk of infections and slow your recovery, so you will need to focus on controlling your diabetes  during the weeks before surgery. Make sure that the doctor who takes care of your diabetes knows about your planned surgery including the date and location.  How do I manage my blood sugar before surgery? Check your blood sugar at least 4 times a day, starting 2 days before surgery, to make sure that the level is not too high or low.  Check your blood sugar the morning of your surgery when you wake up and every 2 hours until you get to the Short Stay unit.  If your blood sugar is less than 70 mg/dL, you will need to treat for low blood sugar: Do not take insulin . Treat a low blood sugar (less than 70 mg/dL) with  cup of clear juice (cranberry or apple), 4 glucose tablets, OR glucose gel. Recheck blood sugar in 15 minutes after treatment (to make sure it is greater than 70 mg/dL). If your blood sugar is not greater than 70 mg/dL on recheck, call [phone] for further instructions. Report your blood sugar to the short stay nurse when you get to Short Stay.  If you are admitted to the hospital after surgery: Your blood sugar will be checked by the staff and you will probably be given insulin  after surgery (instead of oral diabetes medicines) to make sure you have good blood sugar levels. The goal for blood sugar control after surgery is 80-180 mg/dL.                      Do NOT Smoke (Tobacco/Vaping) for 24 hours prior to your procedure.  If  you use a CPAP at night, you may bring your mask/headgear for your overnight stay.   You will be asked to remove any contacts, glasses, piercing's, hearing aid's, dentures/partials prior to surgery. Please bring cases for these items if needed.    Patients discharged the day of surgery will not be allowed to drive home, and someone needs to stay with them for 24 hours.  SURGICAL WAITING ROOM VISITATION Patients may have no more than 2 support people in the waiting area - these visitors may rotate.   Pre-op nurse will coordinate an appropriate time  for 1 ADULT support person, who may not rotate, to accompany patient in pre-op.  Children under the age of 35 must have an adult with them who is not the patient and must remain in the main waiting area with an adult.  If the patient needs to stay at the hospital during part of their recovery, the visitor guidelines for inpatient rooms apply.  Please refer to the [facility] website for the visitor guidelines for any additional information.   If you received a COVID test during your pre-op visit  it is requested that you wear a mask when out in public, stay away from anyone that may not be feeling well and notify your surgeon if you develop symptoms. If you have been in contact with anyone that has tested positive in the last 10 days please notify you surgeon.      Pre-operative CHG Bathing Instructions   You can play a key role in reducing the risk of infection after surgery. Your skin needs to be as free of germs as possible. You can reduce the number of germs on your skin by washing with CHG (chlorhexidine gluconate) soap before surgery. CHG is an antiseptic soap that kills germs and continues to kill germs even after washing.   DO NOT use if you have an allergy to chlorhexidine/CHG or antibacterial soaps. If your skin becomes reddened or irritated, stop using the CHG and notify one of our RNs at [phone].              TAKE A SHOWER THE NIGHT BEFORE SURGERY AND THE DAY OF SURGERY    Please keep in mind the following:  DO NOT shave, including legs and underarms, 48 hours prior to surgery.   You may shave your face before/day of surgery.  Place clean sheets on your bed the night before surgery Use a clean washcloth (not used since being washed) for each shower. DO NOT sleep with pet's night before surgery.  CHG Shower Instructions:  Wash your face and private area with normal soap. If you choose to wash your hair, wash first with your normal shampoo.  After you use shampoo/soap,  rinse your hair and body thoroughly to remove shampoo/soap residue.  Turn the water OFF and apply half the bottle of CHG soap to a CLEAN washcloth.  Apply CHG soap ONLY FROM YOUR NECK DOWN TO YOUR TOES (washing for 3-5 minutes)  DO NOT use CHG soap on face, private areas, open wounds, or sores.  Pay special attention to the area where your surgery is being performed.  If you are having back surgery, having someone wash your back for you may be helpful. Wait 2 minutes after CHG soap is applied, then you may rinse off the CHG soap.  Pat dry with a clean towel  Put on clean pajamas    Additional instructions for the day of surgery: DO NOT APPLY any  lotions, deodorants, cologne, or perfumes.   Do not wear jewelry or makeup Do not wear nail polish, gel polish, artificial nails, or any other type of covering on natural nails (fingers and toes) Do not bring valuables to the hospital. [facility] is not responsible for valuables/personal belongings. Put on clean/comfortable clothes.  Please brush your teeth.  Ask your nurse before applying any prescription medications to the skin.

## 2023-11-02 ENCOUNTER — Encounter (HOSPITAL_COMMUNITY): Payer: Self-pay

## 2023-11-02 ENCOUNTER — Encounter (HOSPITAL_COMMUNITY)
Admission: RE | Admit: 2023-11-02 | Discharge: 2023-11-02 | Disposition: A | Discharge status: Home / Self Care | Source: Ambulatory Visit | Attending: General Surgery | Admitting: General Surgery

## 2023-11-02 ENCOUNTER — Other Ambulatory Visit: Payer: Self-pay

## 2023-11-02 VITALS — BP 112/80 | HR 73 | Temp 98.2°F | Resp 18 | Ht 62.0 in | Wt 140.8 lb

## 2023-11-02 DIAGNOSIS — Z01818 Encounter for other preprocedural examination: Secondary | ICD-10-CM | POA: Insufficient documentation

## 2023-11-02 LAB — CBC
HCT: 44.8 % (ref 36.0–46.0)
Hemoglobin: 14.8 g/dL (ref 12.0–15.0)
MCH: 28.9 pg (ref 26.0–34.0)
MCHC: 33 g/dL (ref 30.0–36.0)
MCV: 87.5 fL (ref 80.0–100.0)
Platelets: 409 10*3/uL — ABNORMAL HIGH (ref 150–400)
RBC: 5.12 MIL/uL — ABNORMAL HIGH (ref 3.87–5.11)
RDW: 13.2 % (ref 11.5–15.5)
WBC: 9.2 10*3/uL (ref 4.0–10.5)
nRBC: 0 % (ref 0.0–0.2)

## 2023-11-02 LAB — BASIC METABOLIC PANEL WITH GFR
Anion gap: 9 (ref 5–15)
BUN: 9 mg/dL (ref 6–20)
CO2: 24 mmol/L (ref 22–32)
Calcium: 9.7 mg/dL (ref 8.9–10.3)
Chloride: 106 mmol/L (ref 98–111)
Creatinine, Ser: 0.66 mg/dL (ref 0.44–1.00)
GFR, Estimated: >60 mL/min (ref >60)
Glucose, Bld: 96 mg/dL (ref 70–99)
Potassium: 3.9 mmol/L (ref 3.5–5.1)
Sodium: 139 mmol/L (ref 135–145)

## 2023-11-02 LAB — GLUCOSE, CAPILLARY: Glucose-Capillary: 121 mg/dL — ABNORMAL HIGH (ref 70–99)

## 2023-11-02 NOTE — Progress Notes (Signed)
 Surgical Instructions   Your procedure is scheduled on Thursday, June 12th, 2025. Report to [facility] Main Entrance "A" at 5:30 A.M., then check in with the Admitting office. Any questions or running late day of surgery: call [phone]  Questions prior to your surgery date: call [phone], Monday-Friday, 8am-4pm. If you experience any cold or flu symptoms such as cough, fever, chills, shortness of breath, etc. between now and your scheduled surgery, please notify us  at the above number.     Remember:  Do not eat after midnight the night before your surgery   You may drink clear liquids until 4:30 the morning of your surgery.   Clear liquids allowed are: Water, Non-Citrus Juices (without pulp), Carbonated Beverages, Clear Tea (no milk, honey, etc.), Black Coffee Only (NO MILK, CREAM OR POWDERED CREAMER of any kind), and Gatorade.    Take these medicines the morning of surgery with A SIP OF WATER: none   May take these medicines IF NEEDED: none    One week prior to surgery, STOP taking any Aspirin  (unless otherwise instructed by your surgeon) Aleve , Naproxen , Ibuprofen , Motrin , Advil , Goody's, BC's, all herbal medications, fish oil, and non-prescription vitamins.   WHAT DO I DO ABOUT MY DIABETES MEDICATION?   Do not take Metformin  (Glucophage ) on the day of surgery.  Do not take glipiZIDE (GLUCOTROL) on the day of surgery.  The night before surgery take 11 units of NOVOLIN 70/30 KWIKPEN (70-30 ). The day of surgery DO NOT take NOVOLIN 70/30 KWIKPEN (70-30) insulin .  HOW TO MANAGE YOUR DIABETES BEFORE AND AFTER SURGERY  Why is it important to control my blood sugar before and after surgery? Improving blood sugar levels before and after surgery helps healing and can limit problems. A way of improving blood sugar control is eating a healthy diet by:  Eating less sugar and carbohydrates  Increasing activity/exercise  Talking with your doctor about reaching your blood  sugar goals High blood sugars (greater than 180 mg/dL) can raise your risk of infections and slow your recovery, so you will need to focus on controlling your diabetes during the weeks before surgery. Make sure that the doctor who takes care of your diabetes knows about your planned surgery including the date and location.  How do I manage my blood sugar before surgery? Check your blood sugar at least 4 times a day, starting 2 days before surgery, to make sure that the level is not too high or low.  Check your blood sugar the morning of your surgery when you wake up and every 2 hours until you get to the Short Stay unit.  If your blood sugar is less than 70 mg/dL, you will need to treat for low blood sugar: Do not take insulin . Treat a low blood sugar (less than 70 mg/dL) with  cup of clear juice (cranberry or apple), 4 glucose tablets, OR glucose gel. Recheck blood sugar in 15 minutes after treatment (to make sure it is greater than 70 mg/dL). If your blood sugar is not greater than 70 mg/dL on recheck, call [phone] for further instructions. Report your blood sugar to the short stay nurse when you get to Short Stay.  If you are admitted to the hospital after surgery: Your blood sugar will be checked by the staff and you will probably be given insulin  after surgery (instead of oral diabetes medicines) to make sure you have good blood sugar levels. The goal for blood sugar control after surgery is 80-180 mg/dL.  Do NOT Smoke (Tobacco/Vaping) for 24 hours prior to your procedure.  If you use a CPAP at night, you may bring your mask/headgear for your overnight stay.   You will be asked to remove any contacts, glasses, piercing's, hearing aid's, dentures/partials prior to surgery. Please bring cases for these items if needed.    Patients discharged the day of surgery will not be allowed to drive home, and someone needs to stay with them for 24 hours.  SURGICAL  WAITING ROOM VISITATION Patients may have no more than 2 support people in the waiting area - these visitors may rotate.   Pre-op nurse will coordinate an appropriate time for 1 ADULT support person, who may not rotate, to accompany patient in pre-op.  Children under the age of 15 must have an adult with them who is not the patient and must remain in the main waiting area with an adult.  If the patient needs to stay at the hospital during part of their recovery, the visitor guidelines for inpatient rooms apply.  Please refer to the [facility] website for the visitor guidelines for any additional information.   If you received a COVID test during your pre-op visit  it is requested that you wear a mask when out in public, stay away from anyone that may not be feeling well and notify your surgeon if you develop symptoms. If you have been in contact with anyone that has tested positive in the last 10 days please notify you surgeon.      Pre-operative CHG Bathing Instructions   You can play a key role in reducing the risk of infection after surgery. Your skin needs to be as free of germs as possible. You can reduce the number of germs on your skin by washing with CHG (chlorhexidine gluconate) soap before surgery. CHG is an antiseptic soap that kills germs and continues to kill germs even after washing.   DO NOT use if you have an allergy to chlorhexidine/CHG or antibacterial soaps. If your skin becomes reddened or irritated, stop using the CHG and notify one of our RNs at [phone].              TAKE A SHOWER THE NIGHT BEFORE SURGERY AND THE DAY OF SURGERY    Please keep in mind the following:  DO NOT shave, including legs and underarms, 48 hours prior to surgery.   You may shave your face before/day of surgery.  Place clean sheets on your bed the night before surgery Use a clean washcloth (not used since being washed) for each shower. DO NOT sleep with pet's night before surgery.  CHG  Shower Instructions:  Wash your face and private area with normal soap. If you choose to wash your hair, wash first with your normal shampoo.  After you use shampoo/soap, rinse your hair and body thoroughly to remove shampoo/soap residue.  Turn the water OFF and apply half the bottle of CHG soap to a CLEAN washcloth.  Apply CHG soap ONLY FROM YOUR NECK DOWN TO YOUR TOES (washing for 3-5 minutes)  DO NOT use CHG soap on face, private areas, open wounds, or sores.  Pay special attention to the area where your surgery is being performed.  If you are having back surgery, having someone wash your back for you may be helpful. Wait 2 minutes after CHG soap is applied, then you may rinse off the CHG soap.  Pat dry with a clean towel  Put on clean pajamas  Additional instructions for the day of surgery: DO NOT APPLY any lotions, deodorants, cologne, or perfumes.   Do not wear jewelry or makeup Do not wear nail polish, gel polish, artificial nails, or any other type of covering on natural nails (fingers and toes) Do not bring valuables to the hospital. [facility] is not responsible for valuables/personal belongings. Put on clean/comfortable clothes.  Please brush your teeth.  Ask your nurse before applying any prescription medications to the skin.

## 2023-11-02 NOTE — Progress Notes (Signed)
 PCP - Tildon Folds Cardiologist - denies  PPM/ICD - denies Device Orders -  Rep Notified -   Chest x-ray - na EKG - 11/02/23 Stress Test - denies ECHO -  Cardiac Cath -   Sleep Study - denies CPAP -   Fasting Blood Sugar - 100-120 Checks Blood Sugar 2 times a day  Last dose of GLP1 agonist-  na GLP1 instructions:   Blood Thinner Instructions:na Aspirin  Instructions:na  ERAS Protcol - clears PRE-SURGERY Ensure or G2- no  COVID TEST- na   Anesthesia review: no  Patient denies shortness of breath, fever, cough and chest pain at PAT appointment   All instructions explained to the patient, with a verbal understanding of the material. Patient agrees to go over the instructions while at home for a better understanding. . The opportunity to ask questions was provided.

## 2023-11-03 LAB — HEMOGLOBIN A1C
Hgb A1c MFr Bld: 7.1 % — ABNORMAL HIGH (ref 4.8–5.6)
Mean Plasma Glucose: 157 mg/dL

## 2023-11-03 NOTE — Anesthesia Preprocedure Evaluation (Addendum)
 Anesthesia Evaluation  Patient identified by MRN, date of birth, ID band Patient awake    Reviewed: Allergy & Precautions, NPO status , Patient's Chart, lab work & pertinent test results  Airway Mallampati: II  TM Distance: >3 FB Neck ROM: Full    Dental no notable dental hx. (+) Teeth Intact, Dental Advisory Given   Pulmonary neg pulmonary ROS   Pulmonary exam normal breath sounds clear to auscultation       Cardiovascular negative cardio ROS Normal cardiovascular exam Rhythm:Regular Rate:Normal     Neuro/Psych negative neurological ROS  negative psych ROS   GI/Hepatic negative GI ROS, Neg liver ROS,,,  Endo/Other  diabetes, Type 2, Oral Hypoglycemic Agents    Renal/GU Lab Results      Component                Value               Date                          K                        3.9                 11/02/2023                CO2                      24                  11/02/2023                BUN                      9                   11/02/2023                CREATININE               0.66                11/02/2023                    Musculoskeletal   Abdominal   Peds  Hematology Lab Results      Component                Value               Date                      WBC                      9.2                 11/02/2023                HGB                      14.8                11/02/2023                HCT  44.8                11/02/2023                MCV                      87.5                11/02/2023                PLT                      409 (H)             11/02/2023              Anesthesia Other Findings All: Cefadinir, cyclobenzaprine  Reproductive/Obstetrics                              Anesthesia Physical Anesthesia Plan  ASA: 2  Anesthesia Plan: General   Post-op Pain Management: Tylenol  PO (pre-op)*, Toradol  IV (intra-op)* and  Precedex   Induction: Intravenous  PONV Risk Score and Plan: 4 or greater and Treatment may vary due to age or medical condition, Midazolam, Dexamethasone  and Ondansetron   Airway Management Planned: Oral ETT  Additional Equipment: None  Intra-op Plan:   Post-operative Plan: Extubation in OR  Informed Consent: I have reviewed the patients History and Physical, chart, labs and discussed the procedure including the risks, benefits and alternatives for the proposed anesthesia with the patient or authorized representative who has indicated his/her understanding and acceptance.     Dental advisory given  Plan Discussed with: CRNA and Surgeon  Anesthesia Plan Comments:         Anesthesia Quick Evaluation

## 2023-11-04 ENCOUNTER — Encounter (HOSPITAL_COMMUNITY)
Admission: RE | Disposition: A | Discharge status: Home / Self Care | Payer: Self-pay | Source: Home / Self Care | Attending: General Surgery

## 2023-11-04 ENCOUNTER — Encounter (HOSPITAL_COMMUNITY): Payer: Self-pay | Admitting: General Surgery

## 2023-11-04 ENCOUNTER — Ambulatory Visit (HOSPITAL_COMMUNITY)
Admission: RE | Admit: 2023-11-04 | Discharge: 2023-11-04 | Disposition: A | Discharge status: Home / Self Care | Attending: General Surgery | Admitting: General Surgery

## 2023-11-04 ENCOUNTER — Other Ambulatory Visit: Payer: Self-pay

## 2023-11-04 ENCOUNTER — Ambulatory Visit (HOSPITAL_COMMUNITY): Admitting: Anesthesiology

## 2023-11-04 DIAGNOSIS — Z7984 Long term (current) use of oral hypoglycemic drugs: Secondary | ICD-10-CM | POA: Insufficient documentation

## 2023-11-04 DIAGNOSIS — E119 Type 2 diabetes mellitus without complications: Secondary | ICD-10-CM | POA: Insufficient documentation

## 2023-11-04 DIAGNOSIS — Z794 Long term (current) use of insulin: Secondary | ICD-10-CM | POA: Insufficient documentation

## 2023-11-04 DIAGNOSIS — K8010 Calculus of gallbladder with chronic cholecystitis without obstruction: Secondary | ICD-10-CM

## 2023-11-04 DIAGNOSIS — Z01818 Encounter for other preprocedural examination: Secondary | ICD-10-CM

## 2023-11-04 DIAGNOSIS — K811 Chronic cholecystitis: Secondary | ICD-10-CM | POA: Insufficient documentation

## 2023-11-04 HISTORY — PX: CHOLECYSTECTOMY: SHX55

## 2023-11-04 LAB — GLUCOSE, CAPILLARY
Glucose-Capillary: 148 mg/dL — ABNORMAL HIGH (ref 70–99)
Glucose-Capillary: 227 mg/dL — ABNORMAL HIGH (ref 70–99)
Glucose-Capillary: 206 mg/dL — ABNORMAL HIGH (ref 70–99)

## 2023-11-04 LAB — POCT PREGNANCY, URINE: Preg Test, Ur: NEGATIVE

## 2023-11-04 SURGERY — LAPAROSCOPIC CHOLECYSTECTOMY
Anesthesia: General | Site: Abdomen

## 2023-11-04 MED ORDER — INSULIN ASPART 100 UNIT/ML IJ SOLN
2.0000 [IU] | Freq: Once | INTRAMUSCULAR | Status: AC
  Administered 2023-11-04: 2 [IU] via SUBCUTANEOUS

## 2023-11-04 MED ORDER — HYDROMORPHONE HCL 1 MG/ML IJ SOLN
0.2500 mg | INTRAMUSCULAR | Status: DC | PRN
  Administered 2023-11-04 (×2): 0.5 mg via INTRAVENOUS

## 2023-11-04 MED ORDER — SODIUM CHLORIDE 0.9 % IR SOLN
Status: DC | PRN
  Administered 2023-11-04: 1000 mL

## 2023-11-04 MED ORDER — BUPIVACAINE-EPINEPHRINE (PF) 0.25% -1:200000 IJ SOLN
INTRAMUSCULAR | Status: AC
Start: 2023-11-04 — End: 2023-11-04
  Filled 2023-11-04: qty 30

## 2023-11-04 MED ORDER — ORAL CARE MOUTH RINSE: 15.0000 mL | Freq: Once | OROMUCOSAL | Status: AC

## 2023-11-04 MED ORDER — LACTATED RINGERS IV SOLN: INTRAVENOUS | Status: DC

## 2023-11-04 MED ORDER — HYDROMORPHONE HCL 1 MG/ML IJ SOLN
INTRAMUSCULAR | Status: AC
  Filled 2023-11-04: qty 1

## 2023-11-04 MED ORDER — MIDAZOLAM HCL 2 MG/2ML IJ SOLN
INTRAMUSCULAR | Status: AC
  Filled 2023-11-04: qty 2

## 2023-11-04 MED ORDER — CEFAZOLIN SODIUM-DEXTROSE 2-3 GM-%(50ML) IV SOLR
INTRAVENOUS | Status: DC | PRN
Start: 2023-11-04 — End: 2023-11-04
  Administered 2023-11-04: 2 g via INTRAVENOUS

## 2023-11-04 MED ORDER — CHLORHEXIDINE GLUCONATE 0.12 % MT SOLN
15.0000 mL | Freq: Once | OROMUCOSAL | Status: AC
  Administered 2023-11-04: 15 mL via OROMUCOSAL
  Filled 2023-11-04: qty 15

## 2023-11-04 MED ORDER — KETOROLAC TROMETHAMINE 30 MG/ML IJ SOLN: 30.0000 mg | Freq: Once | INTRAMUSCULAR | Status: DC | PRN

## 2023-11-04 MED ORDER — 0.9 % SODIUM CHLORIDE (POUR BTL) OPTIME
TOPICAL | Status: DC | PRN
Start: 2023-11-04 — End: 2023-11-04
  Administered 2023-11-04: 1000 mL

## 2023-11-04 MED ORDER — ROCURONIUM BROMIDE 10 MG/ML (PF) SYRINGE
PREFILLED_SYRINGE | INTRAVENOUS | Status: DC | PRN
  Administered 2023-11-04: 40 mg via INTRAVENOUS

## 2023-11-04 MED ORDER — ORAL CARE MOUTH RINSE: 15.0000 mL | Freq: Once | OROMUCOSAL | Status: DC

## 2023-11-04 MED ORDER — ONDANSETRON HCL 4 MG/2ML IJ SOLN
INTRAMUSCULAR | Status: DC | PRN
  Administered 2023-11-04: 4 mg via INTRAVENOUS

## 2023-11-04 MED ORDER — EPHEDRINE SULFATE-NACL 50-0.9 MG/10ML-% IV SOSY
PREFILLED_SYRINGE | INTRAVENOUS | Status: DC | PRN
  Administered 2023-11-04: 5 mg via INTRAVENOUS

## 2023-11-04 MED ORDER — OXYCODONE HCL 5 MG PO TABS: 5.0000 mg | ORAL_TABLET | Freq: Once | ORAL | Status: DC | PRN

## 2023-11-04 MED ORDER — FENTANYL CITRATE (PF) 250 MCG/5ML IJ SOLN
INTRAMUSCULAR | Status: DC | PRN
  Administered 2023-11-04: 50 ug via INTRAVENOUS
  Administered 2023-11-04: 100 ug via INTRAVENOUS

## 2023-11-04 MED ORDER — KETOROLAC TROMETHAMINE 30 MG/ML IJ SOLN
INTRAMUSCULAR | Status: DC | PRN
  Administered 2023-11-04: 30 mg via INTRAVENOUS

## 2023-11-04 MED ORDER — OXYCODONE HCL 5 MG/5ML PO SOLN: 5.0000 mg | Freq: Once | ORAL | Status: DC | PRN

## 2023-11-04 MED ORDER — FENTANYL CITRATE (PF) 250 MCG/5ML IJ SOLN
INTRAMUSCULAR | Status: AC
  Filled 2023-11-04: qty 5

## 2023-11-04 MED ORDER — CHLORHEXIDINE GLUCONATE 0.12 % MT SOLN: 15.0000 mL | Freq: Once | OROMUCOSAL | Status: DC

## 2023-11-04 MED ORDER — PROPOFOL 10 MG/ML IV BOLUS
INTRAVENOUS | Status: AC
Start: 2023-11-04 — End: 2023-11-04
  Filled 2023-11-04: qty 20

## 2023-11-04 MED ORDER — MIDAZOLAM HCL 2 MG/2ML IJ SOLN
INTRAMUSCULAR | Status: DC | PRN
  Administered 2023-11-04: 2 mg via INTRAVENOUS

## 2023-11-04 MED ORDER — DEXAMETHASONE SODIUM PHOSPHATE 10 MG/ML IJ SOLN
INTRAMUSCULAR | Status: DC | PRN
  Administered 2023-11-04: 10 mg via INTRAVENOUS

## 2023-11-04 MED ORDER — ACETAMINOPHEN 10 MG/ML IV SOLN
INTRAVENOUS | Status: DC | PRN
  Administered 2023-11-04: 1000 mg via INTRAVENOUS

## 2023-11-04 MED ORDER — PROPOFOL 10 MG/ML IV BOLUS
INTRAVENOUS | Status: DC | PRN
  Administered 2023-11-04: 150 mg via INTRAVENOUS

## 2023-11-04 MED ORDER — ONDANSETRON HCL 4 MG/2ML IJ SOLN: 4.0000 mg | Freq: Once | INTRAMUSCULAR | Status: DC | PRN

## 2023-11-04 MED ORDER — SUGAMMADEX SODIUM 200 MG/2ML IV SOLN
INTRAVENOUS | Status: DC | PRN
  Administered 2023-11-04: 120 mg via INTRAVENOUS

## 2023-11-04 MED ORDER — TRAMADOL HCL 50 MG PO TABS: 50.0000 mg | ORAL_TABLET | Freq: Four times a day (QID) | ORAL | 0 refills | Status: AC | PRN

## 2023-11-04 MED ORDER — ACETAMINOPHEN 10 MG/ML IV SOLN
INTRAVENOUS | Status: AC
  Filled 2023-11-04: qty 100

## 2023-11-04 MED ORDER — LIDOCAINE 2% (20 MG/ML) 5 ML SYRINGE
INTRAMUSCULAR | Status: DC | PRN
  Administered 2023-11-04: 60 mg via INTRAVENOUS

## 2023-11-04 SURGICAL SUPPLY — 33 items
CANISTER SUCTION 3000ML PPV (SUCTIONS) ×1 IMPLANT
CHLORAPREP W/TINT 26 (MISCELLANEOUS) ×1 IMPLANT
CLIP LIGATING HEMO O LOK GREEN (MISCELLANEOUS) ×1 IMPLANT
COVER SURGICAL LIGHT HANDLE (MISCELLANEOUS) ×1 IMPLANT
COVER TRANSDUCER ULTRASND (DRAPES) ×1 IMPLANT
DERMABOND ADVANCED .7 DNX12 (GAUZE/BANDAGES/DRESSINGS) ×1 IMPLANT
ELECTRODE REM PT RTRN 9FT ADLT (ELECTROSURGICAL) ×1 IMPLANT
GLOVE BIO SURGEON STRL SZ7.5 (GLOVE) ×2 IMPLANT
GOWN STRL REUS W/ TWL LRG LVL3 (GOWN DISPOSABLE) ×2 IMPLANT
GOWN STRL REUS W/ TWL XL LVL3 (GOWN DISPOSABLE) ×1 IMPLANT
GRASPER SUT TROCAR 14GX15 (MISCELLANEOUS) ×1 IMPLANT
IRRIGATION SUCT STRKRFLW 2 WTP (MISCELLANEOUS) ×1 IMPLANT
KIT BASIN OR (CUSTOM PROCEDURE TRAY) ×1 IMPLANT
KIT IMAGING PINPOINTPAQ (MISCELLANEOUS) IMPLANT
KIT TURNOVER KIT B (KITS) ×1 IMPLANT
NDL INSUFFLATION 14GA 120MM (NEEDLE) ×1 IMPLANT
NEEDLE INSUFFLATION 14GA 120MM (NEEDLE) ×1 IMPLANT
NS IRRIG 1000ML POUR BTL (IV SOLUTION) ×1 IMPLANT
PAD ARMBOARD POSITIONER FOAM (MISCELLANEOUS) ×1 IMPLANT
POUCH LAPAROSCOPIC INSTRUMENT (MISCELLANEOUS) ×1 IMPLANT
POUCH RETRIEVAL ECOSAC 10 (ENDOMECHANICALS) IMPLANT
SCISSORS LAP 5X35 DISP (ENDOMECHANICALS) ×1 IMPLANT
SET TUBE SMOKE EVAC HIGH FLOW (TUBING) ×1 IMPLANT
SLEEVE Z-THREAD 5X100MM (TROCAR) ×1 IMPLANT
SPECIMEN JAR SMALL (MISCELLANEOUS) ×1 IMPLANT
SUT MNCRL AB 4-0 PS2 18 (SUTURE) ×1 IMPLANT
TOWEL GREEN STERILE (TOWEL DISPOSABLE) ×1 IMPLANT
TOWEL GREEN STERILE FF (TOWEL DISPOSABLE) ×1 IMPLANT
TRAY LAPAROSCOPIC MC (CUSTOM PROCEDURE TRAY) ×1 IMPLANT
TROCAR 11X100 Z THREAD (TROCAR) ×1 IMPLANT
TROCAR Z-THREAD OPTICAL 5X100M (TROCAR) ×1 IMPLANT
WARMER LAPAROSCOPE (MISCELLANEOUS) ×1 IMPLANT
WATER STERILE IRR 1000ML POUR (IV SOLUTION) ×1 IMPLANT

## 2023-11-04 NOTE — Anesthesia Procedure Notes (Signed)
 Procedure Name: Intubation Date/Time: 11/04/2023 7:49 AM  Performed by: Rochelle Chu, CRNAPre-anesthesia Checklist: Patient identified, Emergency Drugs available, Suction available and Patient being monitored Patient Re-evaluated:Patient Re-evaluated prior to induction Oxygen Delivery Method: Circle system utilized Preoxygenation: Pre-oxygenation with 100% oxygen Induction Type: IV induction Ventilation: Mask ventilation without difficulty Laryngoscope Size: Mac and 3 Grade View: Grade I Tube type: Oral Tube size: 7.0 mm Number of attempts: 1 Airway Equipment and Method: Stylet and Oral airway Placement Confirmation: ETT inserted through vocal cords under direct vision, positive ETCO2 and breath sounds checked- equal and bilateral Secured at: 20 cm Tube secured with: Tape Dental Injury: Teeth and Oropharynx as per pre-operative assessment

## 2023-11-04 NOTE — Anesthesia Postprocedure Evaluation (Signed)
 Anesthesia Post Note  Patient: Tamu Golz  Procedure(s) Performed: LAPAROSCOPIC CHOLECYSTECTOMY (Abdomen)     Patient location during evaluation: PACU Anesthesia Type: General Level of consciousness: awake and alert Pain management: pain level controlled Vital Signs Assessment: post-procedure vital signs reviewed and stable Respiratory status: spontaneous breathing, nonlabored ventilation, respiratory function stable and patient connected to nasal cannula oxygen Cardiovascular status: blood pressure returned to baseline and stable Postop Assessment: no apparent nausea or vomiting Anesthetic complications: no  No notable events documented.  Last Vitals:  Vitals:   11/04/23 0945 11/04/23 1000  BP: 98/73 113/69  Pulse: 94 99  Resp: (!) 22 18  Temp:  37.3 C  SpO2: 96% 97%    Last Pain:  Vitals:   11/04/23 0915  TempSrc:   PainSc: 10-Worst pain ever                 Rosalita Combe

## 2023-11-04 NOTE — Transfer of Care (Signed)
 Immediate Anesthesia Transfer of Care Note  Patient: Alice Velez  Procedure(s) Performed: LAPAROSCOPIC CHOLECYSTECTOMY (Abdomen)  Patient Location: PACU  Anesthesia Type:General  Level of Consciousness: awake, alert , and oriented  Airway & Oxygen Therapy: Patient Spontanous Breathing  Post-op Assessment: Report given to RN, Post -op Vital signs reviewed and stable, and Patient moving all extremities X 4  Post vital signs: Reviewed and stable  Last Vitals:  Vitals Value Taken Time  BP 124/66 11/04/23 08:28  Temp    Pulse 108 11/04/23 08:29  Resp 17 11/04/23 08:29  SpO2 97 % 11/04/23 08:29  Vitals shown include unfiled device data.  Last Pain:  Vitals:   11/04/23 0555  TempSrc: (P) Oral         Complications: No notable events documented.

## 2023-11-04 NOTE — Discharge Instructions (Signed)
 [facility] ______[facility] LAPAROSCOPIC SURGERY: POST OP INSTRUCTIONS Always review your discharge instruction sheet given to you by the facility where your surgery was performed. IF YOU HAVE DISABILITY OR FAMILY LEAVE FORMS, YOU MUST BRING THEM TO THE OFFICE FOR PROCESSING.   DO NOT GIVE THEM TO YOUR DOCTOR.  A prescription for pain medication may be given to you upon discharge.  Take your pain medication as prescribed, if needed.  If narcotic pain medicine is not needed, then you may take acetaminophen (Tylenol) or ibuprofen (Advil) as needed. Take your usually prescribed medications unless otherwise directed. If you need a refill on your pain medication, please contact your pharmacy.  They will contact our office to request authorization. Prescriptions will not be filled after 5pm or on week-ends. You should follow a light diet the first few days after arrival home, such as soup and crackers, etc.  Be sure to include lots of fluids daily. Most patients will experience some swelling and bruising in the area of the incisions.  Ice packs will help.  Swelling and bruising can take several days to resolve.  It is common to experience some constipation if taking pain medication after surgery.  Increasing fluid intake and taking a stool softener (such as Colace) will usually help or prevent this problem from occurring.  A mild laxative (Milk of Magnesia or Miralax) should be taken according to package instructions if there are no bowel movements after 48 hours. Unless discharge instructions indicate otherwise, you may remove your bandages 24-48 hours after surgery, and you may shower at that time.  You may have steri-strips (small skin tapes) in place directly over the incision.  These strips should be left on the skin for 7-10 days.  If your surgeon used skin glue on the incision, you may shower in 24 hours.  The glue will flake off over the next 2-3 weeks.  Any sutures or staples will be  removed at the office during your follow-up visit. ACTIVITIES:  You may resume regular (light) daily activities beginning the next day--such as daily self-care, walking, climbing stairs--gradually increasing activities as tolerated.  You may have sexual intercourse when it is comfortable.  Refrain from any heavy lifting or straining until approved by your doctor. You may drive when you are no longer taking prescription pain medication, you can comfortably wear a seatbelt, and you can safely maneuver your car and apply brakes. RETURN TO WORK:  __________________________________________________________ Bonita Quin should see your doctor in the office for a follow-up appointment approximately 2-3 weeks after your surgery.  Make sure that you call for this appointment within a day or two after you arrive home to insure a convenient appointment time. OTHER INSTRUCTIONS: __________________________________________________________________________________________________________________________ __________________________________________________________________________________________________________________________ WHEN TO CALL YOUR DOCTOR: Fever over 101.0 Inability to urinate Continued bleeding from incision. Increased pain, redness, or drainage from the incision. Increasing abdominal pain  The clinic staff is available to answer your questions during regular business hours.  Please don't hesitate to call and ask to speak to one of the nurses for clinical concerns.  If you have a medical emergency, go to the nearest emergency room or call 911.  A surgeon from [facility] is always on call at the hospital. [address], [address], [place], [place]  [address] ? [address] [address], [place], [place]   [address] [phone] ? [phone] ? FAX [phone] Web site: [email or web address]

## 2023-11-04 NOTE — H&P (Signed)
 Chief Complaint: New Consultation (Acute Cholecystitis)   History of Present Illness: Alice Velez is a 28 y.o. female who is seen today as an office consultation at the request of Dr. Janee Mech for evaluation of New Consultation (Acute Cholecystitis) .  History of Present Illness Alice Velez is a 28 year old female with diabetes and hyperlipidemia who presents with gallbladder pain.  She experiences persistent right upper quadrant abdominal pain for three months, constant with exacerbations postprandially and during fasting. There is decreased appetite at times, but no nausea or diarrhea. Imaging studies, including X-rays, ultrasound, and CT scan, show gallbladder sludge. She has no history of abdominal surgeries.  Review of Systems: A complete review of systems was obtained from the patient. I have reviewed this information and discussed as appropriate with the patient. See HPI as well for other ROS.  ROS   Medical History: Past Medical History:  Diagnosis Date  Hyperlipidemia  Hypertension   There is no problem list on file for this patient.  Past Surgical History:  Procedure Laterality Date  CESAREAN SECTION 02/23/2016    Allergies  Allergen Reactions  Cyclobenzaprine Swelling   Current Outpatient Medications on File Prior to Visit  Medication Sig Dispense Refill  atorvastatin (LIPITOR) 40 MG tablet Take 40 mg by mouth once daily  glipiZIDE (GLUCOTROL) 10 MG tablet take 1 tablet by mouth twice daily before a meal  metFORMIN  (GLUCOPHAGE ) 1000 MG tablet Take 1,000 mg by mouth  NOVOLIN 70-30 FLEXPEN U-100 100 unit/mL (70-30) pen injector Inject subcutaneously  amLODIPine (NORVASC) 2.5 MG tablet Take 2.5 mg by mouth once daily  atorvastatin (LIPITOR) 40 MG tablet Take 40 mg by mouth (Patient not taking: Reported on 09/22/2023)  levothyroxine (SYNTHROID) 100 MCG tablet Take 100 mcg by mouth once daily Take on an empty stomach with a glass of water at least 30-60  minutes before breakfast.  losartan (COZAAR) 100 MG tablet Take 100 mg by mouth once daily  rosuvastatin (CRESTOR) 10 MG tablet Take 10 mg by mouth once daily  traMADoL (ULTRAM) 50 mg tablet Take 50 mg by mouth every 6 (six) hours as needed for Pain   No current facility-administered medications on file prior to visit.   Family History  Problem Relation Age of Onset  Hyperlipidemia (Elevated cholesterol) Mother  Diabetes Mother    Social History   Tobacco Use  Smoking Status Never  Smokeless Tobacco Never    Social History   Socioeconomic History  Marital status: Single  Tobacco Use  Smoking status: Never  Smokeless tobacco: Never  Vaping Use  Vaping status: Never Used   Social Drivers of Corporate investment banker Strain: Not on File (09/11/2021)  Received from [employer]  Financial Resource Strain: 0  Food Insecurity: Not on File (02/18/2023)  Received from [employer] Insecurity  Food: 0  Transportation Needs: Not on File (09/11/2021)  Received from [employer] Needs  Transportation: 0  Physical Activity: Not on File (09/11/2021)  Received from [facility]  Physical Activity  Physical Activity: 0  Stress: Not on File (09/11/2021)  Received from [facility]  Stress  Stress: 0  Social Connections: Not on File (02/01/2023)  Received from [employer]  Social Connections  Connectedness: 0  Housing Stability: Not on File (09/11/2021)  Received from [employer] Stability  Housing: 0   Objective:   Vitals:  09/22/23 0920 09/22/23 0921  BP: 120/82  Pulse: 84  SpO2: 96%  Weight: 66 kg (145  lb 6.4 oz)  Height: 154.9 cm (5' 1)  PainSc: 5  PainLoc: Arm   Body mass index is 27.47 kg/m.  Physical Exam   Assessment and Plan:  Diagnoses and all orders for this visit:  Symptomatic cholelithiasis   Alice Velez is a 28 y.o. female   We will proceed to the OR for a lap cholecystectomy. All risks and benefits were discussed with  the patient to generally include: infection, bleeding, possible need for post op ERCP, damage to the bile ducts, and bile leak. Alternatives were offered and described. All questions were answered and the patient voiced understanding of the procedure and wishes to proceed at this point with a laparoscopic cholecystectomy  No follow-ups on file.  Shela Derby, MD, [facility], [facility]

## 2023-11-04 NOTE — Op Note (Signed)
 11/04/2023  8:13 AM  PATIENT:  Alice Velez  28 y.o. female  PRE-OPERATIVE DIAGNOSIS:  GALLSTONE  POST-OPERATIVE DIAGNOSIS:  GALLSTONE, chronic cholecystitis  PROCEDURE:  Procedure(s) with comments: LAPAROSCOPIC CHOLECYSTECTOMY (N/A) - LATERALITY: NA  SURGEON:  Surgeons and Role:    Shela Derby, MD - Primary  ASSISTANTS: Woodroe Hazel, RNFA   ANESTHESIA:   local and general  EBL:  minimal   BLOOD ADMINISTERED:none  DRAINS: none   LOCAL MEDICATIONS USED:  BUPIVICAINE   SPECIMEN:  Source of Specimen:  gallbladder  DISPOSITION OF SPECIMEN:  PATHOLOGY  COUNTS:  YES  TOURNIQUET:  * No tourniquets in log *  DICTATION: .Dragon Dictation  The patient was taken to the operating and placed in the supine position with bilateral SCDs in place.  The patient was prepped and draped in the usual sterile fashion. A time out was called and all facts were verified. A pneumoperitoneum was obtained via A Veress needle technique to a pressure of 14mm of mercury.  A 5mm trochar was then placed in the right upper quadrant under visualization, and there were no injuries to any abdominal organs. A 11 mm port was then placed in the umbilical region after infiltrating with local anesthesia under direct visualization. A second and third epigastric port and right lower quadrant port placement under direct visualization, respectively.    The gallbladder was identified and retracted, the peritoneum was then sharply dissected from the gallbladder and this dissection was carried down to Calot's triangle. The cystic duct was identified and stripped away circumferentially and seen going into the gallbladder 360, the critical angle was obtained.  2 clips were placed proximally one distally and the cystic duct transected. The cystic artery was identified and 2 clips placed proximally and one distally and transected.  We then proceeded to remove the gallbladder off the hepatic fossa with Bovie  cautery. A retrieval bag was then placed in the abdomen and gallbladder placed in the bag. The hepatic fossa was then reexamined and hemostasis was achieved with Bovie cautery and was excellent at the end of the case.   The subhepatic fossa and perihepatic fossa was then irrigated until the effluent was clear.  The gallbladder and bag were removed from the abdominal cavity. The 11 mm trocar fascia was reapproximated with the Endo Close #1 Vicryl x1.  The pneumoperitoneum was evacuated and all trochars removed under direct visulalization.  The skin was then closed with 4-0 Monocryl and the skin dressed with Dermabond.    The patient was awaken from general anesthesia and taken to the recovery room in stable condition.   PLAN OF CARE: Discharge to home after PACU  PATIENT DISPOSITION:  PACU - hemodynamically stable.   Delay start of Pharmacological VTE agent (>24hrs) due to surgical blood loss or risk of bleeding: not applicable

## 2023-11-05 ENCOUNTER — Encounter (HOSPITAL_COMMUNITY): Payer: Self-pay | Admitting: General Surgery

## 2023-11-05 LAB — SURGICAL PATHOLOGY

## 2024-04-18 ENCOUNTER — Emergency Department (HOSPITAL_COMMUNITY): Admission: EM | Admit: 2024-04-18 | Discharge: 2024-04-18 | Disposition: A | Discharge status: Home / Self Care

## 2024-04-18 ENCOUNTER — Other Ambulatory Visit: Payer: Self-pay

## 2024-04-18 DIAGNOSIS — E119 Type 2 diabetes mellitus without complications: Secondary | ICD-10-CM | POA: Insufficient documentation

## 2024-04-18 DIAGNOSIS — N12 Tubulo-interstitial nephritis, not specified as acute or chronic: Secondary | ICD-10-CM | POA: Insufficient documentation

## 2024-04-18 DIAGNOSIS — Z7984 Long term (current) use of oral hypoglycemic drugs: Secondary | ICD-10-CM | POA: Insufficient documentation

## 2024-04-18 LAB — CBC WITH DIFFERENTIAL/PLATELET
Abs Immature Granulocytes: 0.03 K/uL (ref 0.00–0.07)
Basophils Absolute: 0 K/uL (ref 0.0–0.1)
Basophils Relative: 0 %
Eosinophils Absolute: 0.1 K/uL (ref 0.0–0.5)
Eosinophils Relative: 1 %
HCT: 43.1 % (ref 36.0–46.0)
Hemoglobin: 14 g/dL (ref 12.0–15.0)
Immature Granulocytes: 0 %
Lymphocytes Relative: 31 %
Lymphs Abs: 3.2 K/uL (ref 0.7–4.0)
MCH: 29.5 pg (ref 26.0–34.0)
MCHC: 32.5 g/dL (ref 30.0–36.0)
MCV: 90.9 fL (ref 80.0–100.0)
Monocytes Absolute: 0.6 K/uL (ref 0.1–1.0)
Monocytes Relative: 6 %
Neutro Abs: 6.5 K/uL (ref 1.7–7.7)
Neutrophils Relative %: 62 %
Platelets: 437 K/uL — ABNORMAL HIGH (ref 150–400)
RBC: 4.74 MIL/uL (ref 3.87–5.11)
RDW: 13.2 % (ref 11.5–15.5)
WBC: 10.5 K/uL (ref 4.0–10.5)
nRBC: 0 % (ref 0.0–0.2)

## 2024-04-18 LAB — URINALYSIS, ROUTINE W REFLEX MICROSCOPIC
Bilirubin Urine: NEGATIVE
Glucose, UA: NEGATIVE mg/dL
Hgb urine dipstick: NEGATIVE
Ketones, ur: NEGATIVE mg/dL
Nitrite: NEGATIVE
Protein, ur: NEGATIVE mg/dL
Specific Gravity, Urine: 1.003 — ABNORMAL LOW (ref 1.005–1.030)
pH: 6 (ref 5.0–8.0)

## 2024-04-18 LAB — COMPREHENSIVE METABOLIC PANEL WITH GFR
ALT: 19 U/L (ref 0–44)
AST: 24 U/L (ref 15–41)
Albumin: 4.2 g/dL (ref 3.5–5.0)
Alkaline Phosphatase: 51 U/L (ref 38–126)
Anion gap: 10 (ref 5–15)
BUN: 9 mg/dL (ref 6–20)
CO2: 25 mmol/L (ref 22–32)
Calcium: 9.5 mg/dL (ref 8.9–10.3)
Chloride: 103 mmol/L (ref 98–111)
Creatinine, Ser: 0.79 mg/dL (ref 0.44–1.00)
GFR, Estimated: >60 mL/min (ref >60)
Glucose, Bld: 107 mg/dL — ABNORMAL HIGH (ref 70–99)
Potassium: 4.3 mmol/L (ref 3.5–5.1)
Sodium: 138 mmol/L (ref 135–145)
Total Bilirubin: 0.5 mg/dL (ref 0.0–1.2)
Total Protein: 7.1 g/dL (ref 6.5–8.1)

## 2024-04-18 LAB — WET PREP, GENITAL
Clue Cells Wet Prep HPF POC: NONE SEEN
Sperm: NONE SEEN
Trich, Wet Prep: NONE SEEN
WBC, Wet Prep HPF POC: <10 (ref <10)
Yeast Wet Prep HPF POC: NONE SEEN

## 2024-04-18 LAB — PREGNANCY, URINE: Preg Test, Ur: NEGATIVE

## 2024-04-18 LAB — LIPASE, BLOOD: Lipase: 45 U/L (ref 11–51)

## 2024-04-18 MED ORDER — CEPHALEXIN 500 MG PO CAPS
500.0000 mg | ORAL_CAPSULE | Freq: Four times a day (QID) | ORAL | 0 refills | Status: AC
Start: 2024-04-18 — End: ?

## 2024-04-18 MED ORDER — CEPHALEXIN 250 MG PO CAPS
500.0000 mg | ORAL_CAPSULE | Freq: Once | ORAL | Status: AC
  Administered 2024-04-18: 500 mg via ORAL
  Filled 2024-04-18: qty 2

## 2024-04-18 MED ORDER — ONDANSETRON 4 MG PO TBDP: 4.0000 mg | ORAL_TABLET | Freq: Three times a day (TID) | ORAL | 0 refills | Status: AC | PRN

## 2024-04-18 NOTE — ED Provider Notes (Signed)
 [facility] EMERGENCY DEPARTMENT AT [facility] Provider Note   CSN: [id number] Arrival date & time: 04/18/24  1651     Patient presents with: No chief complaint on file.   Alice Velez Alice Velez is a 28 y.o. female.   28 year old female with past medical history of diabetes and pyelonephritis in the past presents emergency department today with symptoms consistent with previous episodes of pyelonephritis.  The patient states that she has been having right-sided flank pain over the past few days.  Has had nausea but denies any vomiting.  She denies any fevers but has had some chills.  Denies any history of kidney stones.  She states that the pain is mild in character.  It is relatively persistent.  She came to the ER today for further evaluation regarding this due to ongoing symptoms.  She is reporting some increased urinary frequency and urgency as well as some dysuria.  She denies any vaginal discharge currently but states that she has had some brownish discharge on and off now over the past few days prior to today.        Prior to Admission medications   Medication Sig Start Date End Date Taking? Authorizing Provider  cephALEXin  (KEFLEX ) 500 MG capsule Take 1 capsule (500 mg total) by mouth 4 (four) times daily. 04/18/24  Yes Ula Prentice SAUNDERS, MD  ondansetron  (ZOFRAN -ODT) 4 MG disintegrating tablet Take 1 tablet (4 mg total) by mouth every 8 (eight) hours as needed for nausea or vomiting. 04/18/24  Yes Ula Prentice SAUNDERS, MD  atorvastatin (LIPITOR) 20 MG tablet Take 20 mg by mouth at bedtime. 10/14/23   [provider]  glipiZIDE (GLUCOTROL) 10 MG tablet Take 1 tablet by mouth daily. 08/26/23   [provider]  metFORMIN  (GLUCOPHAGE ) 1000 MG tablet Take 1 tablet (1,000 mg total) by mouth 2 (two) times daily with a meal. 02/26/16   Riccio, Angela C, DO  NOVOLIN 70/30 KWIKPEN (70-30) 100 UNIT/ML KwikPen Inject 12-16 Units into the skin See admin instructions. Inject 16  units morning and 13 units at night 08/26/23 11/24/23  [provider]  traMADol  (ULTRAM ) 50 MG tablet Take 1 tablet (50 mg total) by mouth every 6 (six) hours as needed. 11/04/23 11/03/24  Rubin Calamity, MD    Allergies: Cefdinir  and Cyclobenzaprine    Review of Systems  Genitourinary:  Positive for dysuria and frequency.  All other systems reviewed and are negative.   Updated Vital Signs BP 108/75 (BP Location: Right Arm)   Pulse 72   Temp 97.8 F (36.6 C)   Resp 16   SpO2 98%   Physical Exam Vitals and nursing note reviewed.   Gen: NAD Eyes: PERRL, EOMI HEENT: no oropharyngeal swelling Neck: trachea midline Resp: clear to auscultation bilaterally Card: RRR, no murmurs, rubs, or gallops Abd: nontender, nondistended, right-sided CVA tenderness noted Extremities: no calf tenderness, no edema Vascular: 2+ radial pulses bilaterally, 2+ DP pulses bilaterally Skin: no rashes Psyc: acting appropriately   (all labs ordered are listed, but only abnormal results are displayed) Labs Reviewed  CBC WITH DIFFERENTIAL/PLATELET - Abnormal; Notable for the following components:      Result Value   Platelets 437 (*)    All other components within normal limits  URINALYSIS, ROUTINE W REFLEX MICROSCOPIC - Abnormal; Notable for the following components:   Color, Urine STRAW (*)    Specific Gravity, Urine 1.003 (*)    Leukocytes,Ua MODERATE (*)    Bacteria, UA MANY (*)  All other components within normal limits  COMPREHENSIVE METABOLIC PANEL WITH GFR - Abnormal; Notable for the following components:   Glucose, Bld 107 (*)    All other components within normal limits  WET PREP, GENITAL  PREGNANCY, URINE  LIPASE, BLOOD  GC/CHLAMYDIA PROBE AMP ([place]) NOT AT [facility]    EKG: None  Radiology: No results found.   Procedures   Medications Ordered in the ED  cephALEXin  (KEFLEX ) capsule 500 mg (has no administration in time range)                                     Medical Decision Making 28 year old female with past medical history of pyelonephritis in the past presenting to the emergency department today with symptoms consistent with pyelonephritis.  Will further evaluate her here with basic labs as well as a urinalysis here for further evaluation.  In regards to the intermittent vaginal discharge we will have the patient self swab.  I will reevaluate for ultimate disposition.  The patient's urinalysis is consistent with urinary tract infection and pyelonephritis.  The patient is given Keflex .  She had a cefdinir  allergy noted but looking through the records from her primary care doctor it apparently caused her feet to swell which is not a true allergy.  Will give the patient Keflex  here based on most recent E. coli susceptibilities.  Her wet prep did not show any abnormalities.  She will be discharged with return precautions.  Amount and/or Complexity of Data Reviewed Labs: ordered.  Risk Prescription drug management.        Final diagnoses:  Pyelonephritis    ED Discharge Orders          Ordered    cephALEXin  (KEFLEX ) 500 MG capsule  4 times daily        04/18/24 2323    ondansetron  (ZOFRAN -ODT) 4 MG disintegrating tablet  Every 8 hours PRN        04/18/24 2323               Ula Prentice SAUNDERS, MD 04/18/24 2324

## 2024-04-18 NOTE — ED Provider Triage Note (Signed)
 Emergency Medicine Provider Triage Evaluation Note  Alice Velez , a 28 y.o. female  was evaluated in triage.  Pt complains of flank pain. Endorse abd pain and R side back pain since yesterday.  Also has chills, urinary discomfort and vaginal irritation.  New sexual lpartner.  Hx of pyelonephritis in the past and this felt similar.  Review of Systems  Positive: As above Negative: As above  Physical Exam  BP 124/79 (BP Location: Right Arm)   Pulse 89   Temp 98.1 F (36.7 C) (Oral)   Resp 16   SpO2 99%  Gen:   Awake, no distress   Resp:  Normal effort  MSK:   Moves extremities without difficulty  Other:    Medical Decision Making  Medically screening exam initiated at 5:06 PM.  Appropriate orders placed.  Alice Velez was informed that the remainder of the evaluation will be completed by another provider, this initial triage assessment does not replace that evaluation, and the importance of remaining in the ED until their evaluation is complete.     Nivia Colon, PA-C 04/18/24 [phone]

## 2024-04-18 NOTE — Discharge Instructions (Signed)
 It looks like you have another kidney infection.  Please take the cephalexin  4 times daily as prescribed.  Follow-up with your doctor.  Take the Zofran  as needed for nausea.  Return to the ER for worsening symptoms.

## 2024-04-18 NOTE — ED Triage Notes (Signed)
 Spanish interpreter used; pt states she has been having pain in her ovaries and back since yesterday; endorses dysuria; reports chills, denies fevers; denies abnormal vaginal discharge; endorses new sexual partner, states she is using protection; states he had cholecystectomy in July, reports pain at the site

## 2024-04-19 LAB — GC/CHLAMYDIA PROBE AMP (~~LOC~~) NOT AT ARMC
Chlamydia: NEGATIVE
Comment: NEGATIVE
Comment: NORMAL
Neisseria Gonorrhea: NEGATIVE
